# Patient Record
Sex: Female | Born: 1985 | Race: Black or African American | Hispanic: No | Smoking: Current every day smoker
Health system: Southern US, Community
[De-identification: ages and names within clinical notes are randomized; demographics above are authoritative.]

## PROBLEM LIST (undated history)

## (undated) HISTORY — PX: NO PAST SURGERIES: SHX2092

---

## 2005-11-07 ENCOUNTER — Ambulatory Visit (HOSPITAL_COMMUNITY): Admission: RE | Admit: 2005-11-07 | Discharge: 2005-11-07 | Payer: Self-pay | Admitting: Family Medicine

## 2005-12-06 ENCOUNTER — Ambulatory Visit (HOSPITAL_COMMUNITY): Admission: RE | Admit: 2005-12-06 | Discharge: 2005-12-06 | Payer: Self-pay | Admitting: Obstetrics and Gynecology

## 2007-06-03 IMAGING — US US OB COMP +14 WK
1 series · 13 of 28 positions shown · non-contrast
Comparison: none

CLINICAL DATA: 18 week 0 day assigned gestational age by prior ultrasound.  Evaluate fetal anatomy and growth.

[Series 1: us ob comp +14 wk · 0.39mm/px · 13 of 79 slices shown]
[im 3/79]
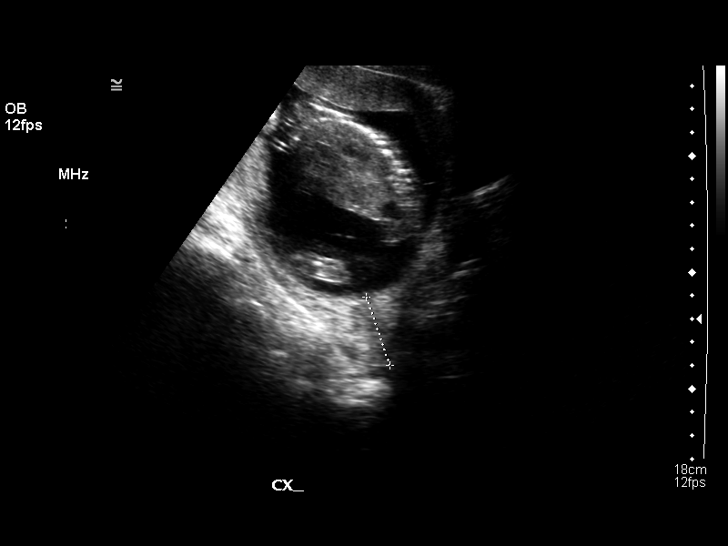
[im 9/79]
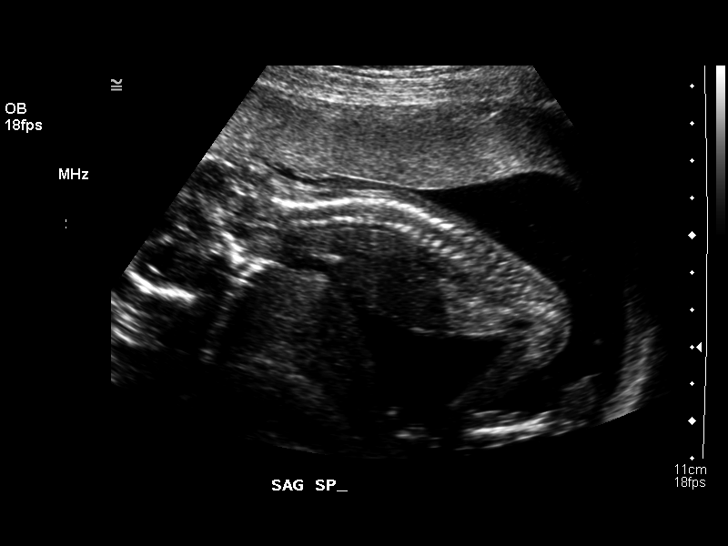
[im 15/79]
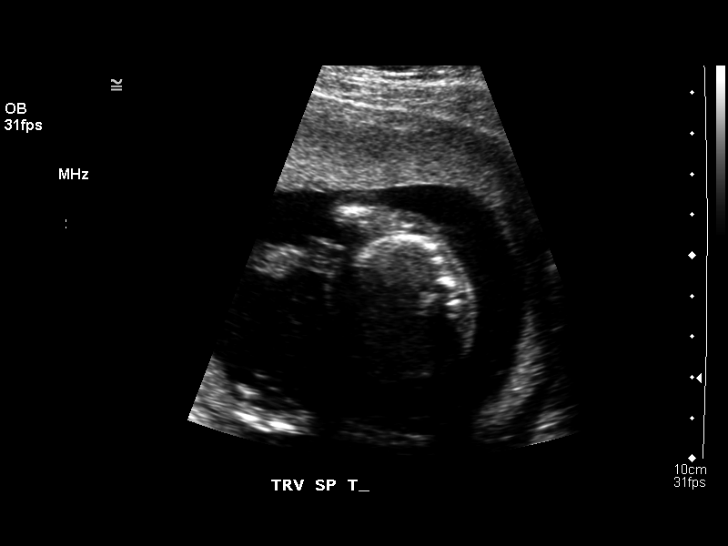
[im 21/79]
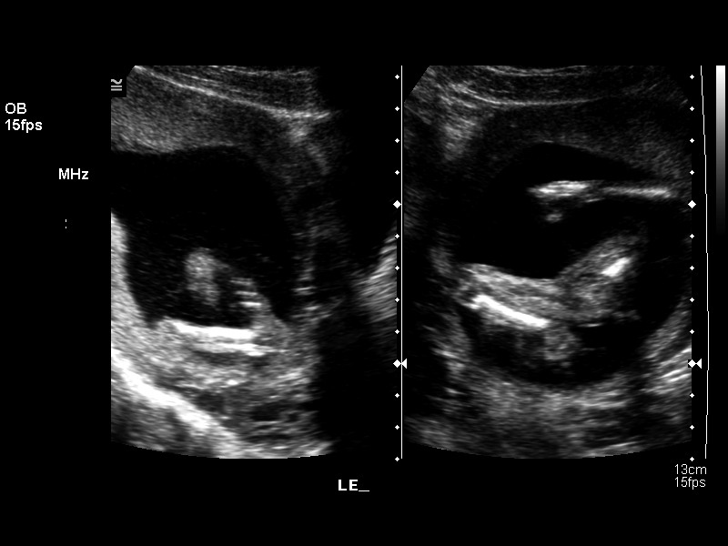
[im 27/79]
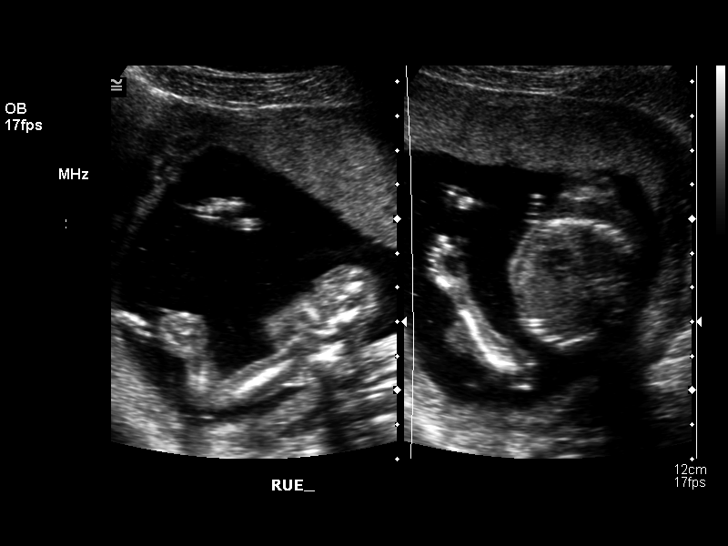
[im 32/79]
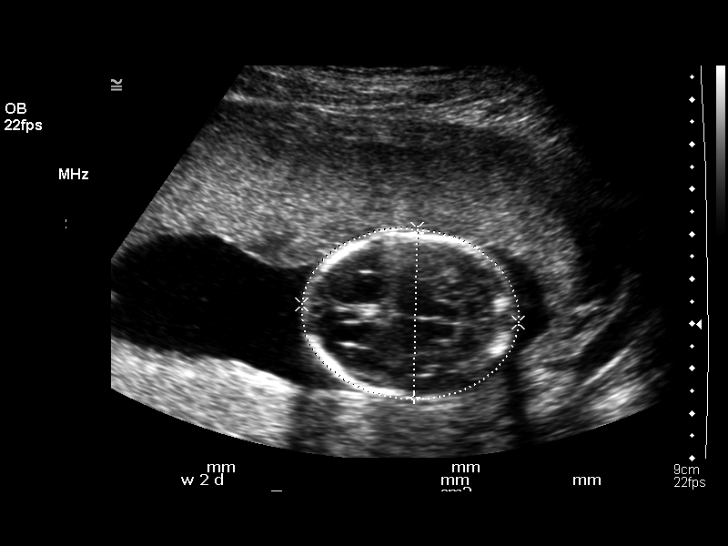
[im 41/79]
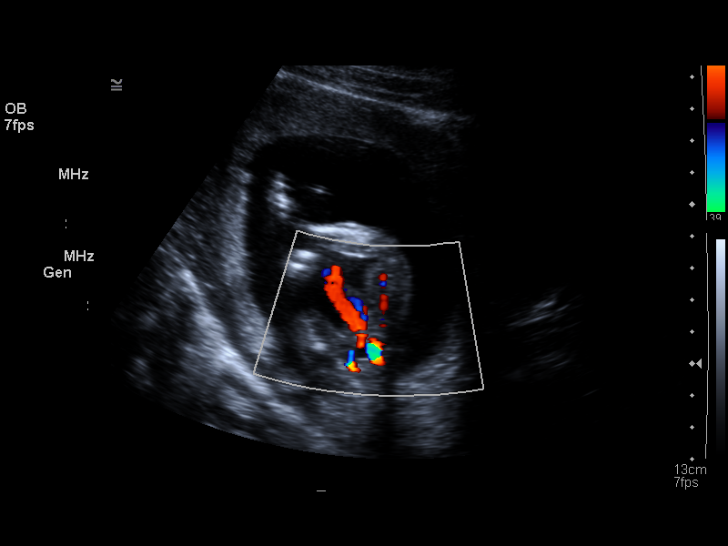
[im 47/79]
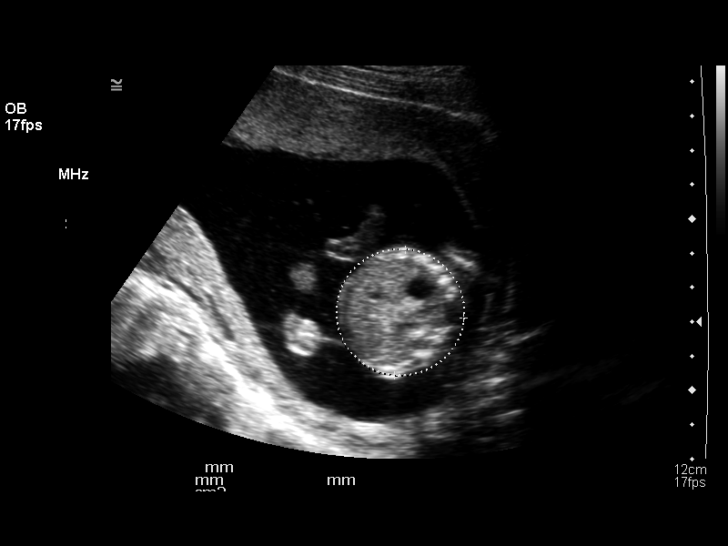
[im 53/79]
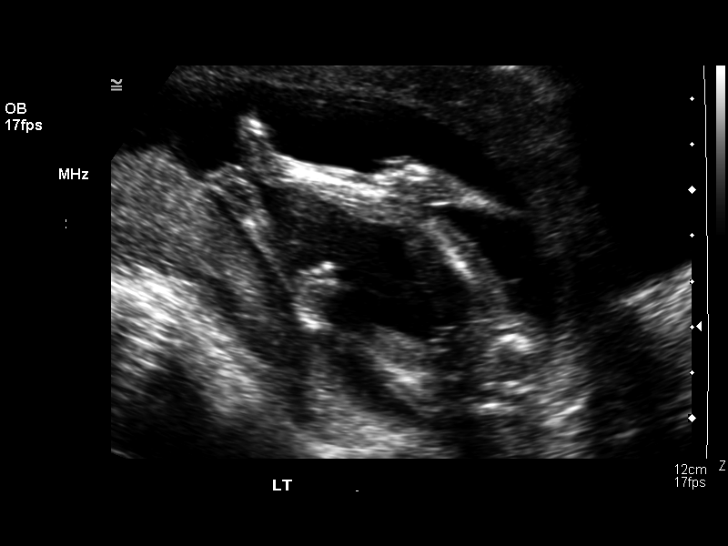
[im 58/79]
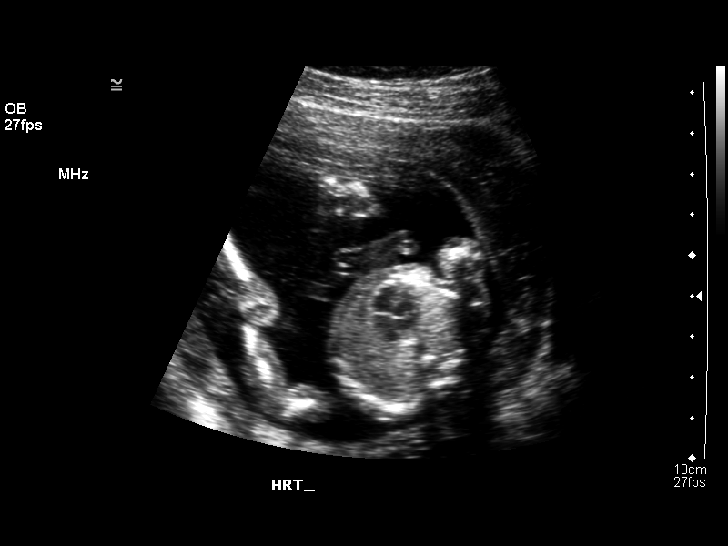
[im 64/79]
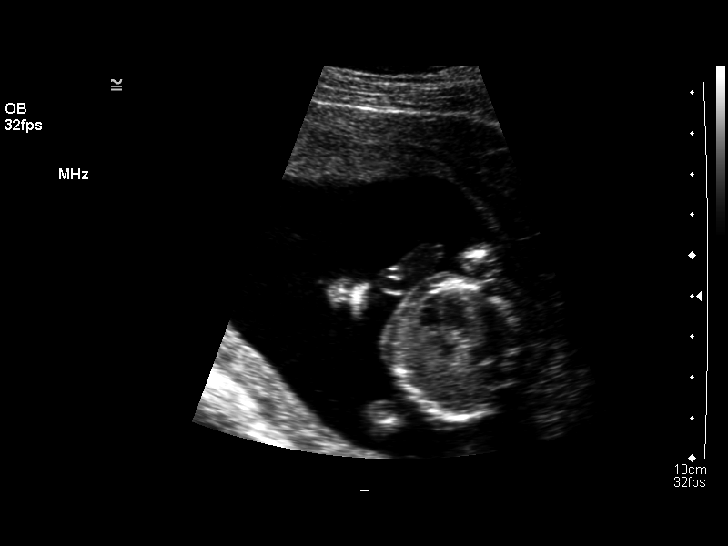
[im 70/79]
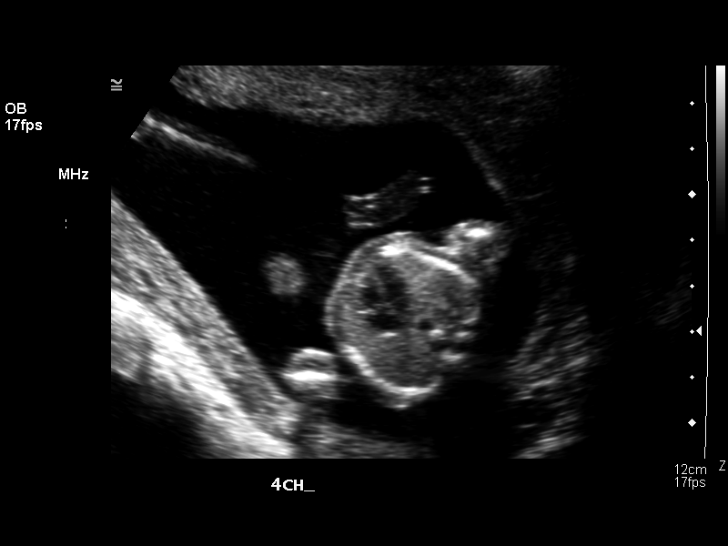
[im 76/79]
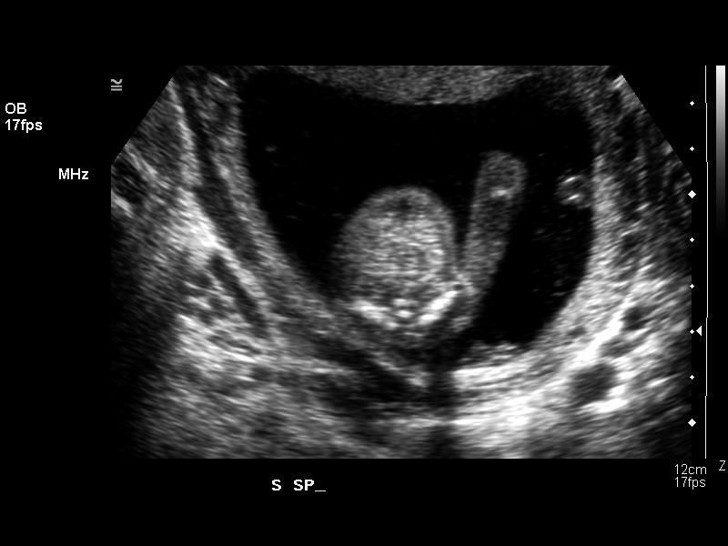

[13 of 28 positions shown; findings below may reference images not displayed]

OBSTETRICAL ULTRASOUND:
 Number of Fetuses:  1
 Heart Rate:  139 bpm
 Movement:  Yes
 Breathing:  No  
 Presentation:  Breech
 Placental Location:  Anterior, right lateral
 Grade:  I
 Previa:  No
 Amniotic Fluid (Subjective):  Normal
 Amniotic Fluid (Objective):   4.6 cm vertical pocket 

 FETAL BIOMETRY
 BPD:   3.7 cm  17 w 2 d
 HC:   13.9 cm  17 w 2 d
 AC:  11.7 cm  17 w 3 d
 FL:  2.4 cm  17 w 1 d

 MEAN GA:  17 w 2 d    US EDC:  05/14/06
 Assigned GA:  18 w 0 d    Assigned EDC:  05/09/06

 FETAL ANATOMY
 Lateral Ventricles:  Visualized  
 Thalami/CSP:  Visualized  
 Posterior Fossa:  Visualized   
 Nuchal Region:  Visualized 
 Spine:  Visualized  
 4 Chamber Heart on Left:  Visualized  
 Stomach on Left:  Visualized  
 3 Vessel Cord:  Visualized 
 Cord Insertion site:  Visualized 
 Kidneys:  Visualized   
 Bladder:  Visualized   
 Extremities:  Visualized  

 ADDITIONAL ANATOMY VISUALIZED:  LVOT, RVOT, upper lip, orbits, profile, diaphragm, heel, 5th digit, and aortic arch.

 MATERNAL UTERINE AND ADNEXAL FINDINGS
 Cervix: 3.1 cm transabdominal.
IMPRESSION: 1.  Single living intrauterine gestation with assigned gestational age of 18 week 0 days by prior ultrasound.  Appropriate fetal growth.
 2.  No evidence of fetal anatomic abnormality.

## 2013-11-07 ENCOUNTER — Ambulatory Visit (INDEPENDENT_AMBULATORY_CARE_PROVIDER_SITE_OTHER): Payer: Self-pay | Admitting: Family Medicine

## 2013-11-07 ENCOUNTER — Encounter: Payer: Self-pay | Admitting: Family Medicine

## 2013-11-07 VITALS — BP 98/82 | HR 90 | Temp 98.3°F | Ht 65.0 in | Wt 203.0 lb

## 2013-11-07 DIAGNOSIS — B353 Tinea pedis: Secondary | ICD-10-CM

## 2013-11-07 MED ORDER — ITRACONAZOLE 100 MG PO CAPS: 200.0000 mg | ORAL_CAPSULE | Freq: Two times a day (BID) | ORAL | Status: DC

## 2013-11-07 NOTE — Patient Instructions (Addendum)
Athlete's Foot Athlete's foot (tinea pedis) is a fungal infection of the skin on the feet. It often occurs on the skin between the toes or underneath the toes. It can also occur on the soles of the feet. Athlete's foot is more likely to occur in hot, humid weather. Not washing your feet or changing your socks often enough can contribute to athlete's foot. The infection can spread from person to person (contagious). CAUSES Athlete's foot is caused by a fungus. This fungus thrives in warm, moist places. Most people get athlete's foot by sharing shower stalls, towels, and wet floors with an infected person. People with weakened immune systems, including those with diabetes, may be more likely to get athlete's foot. SYMPTOMS   Itchy areas between the toes or on the soles of the feet.  White, flaky, or scaly areas between the toes or on the soles of the feet.  Tiny, intensely itchy blisters between the toes or on the soles of the feet.  Tiny cuts on the skin. These cuts can develop a bacterial infection.  Thick or discolored toenails. DIAGNOSIS  Your caregiver can usually tell what the problem is by doing a physical exam. Your caregiver may also take a skin sample from the rash area. The skin sample may be examined under a microscope, or it may be tested to see if fungus will grow in the sample. A sample may also be taken from your toenail for testing. TREATMENT  Over-the-counter and prescription medicines can be used to kill the fungus. These medicines are available as powders or creams. Your caregiver can suggest medicines for you. Fungal infections respond slowly to treatment. You may need to continue using your medicine for several weeks. PREVENTION   Do not share towels.  Wear sandals in wet areas, such as shared locker rooms and shared showers.  Keep your feet dry. Wear shoes that allow air to circulate. Wear cotton or wool socks. HOME CARE INSTRUCTIONS   Take medicines as directed by  your caregiver. Do not use steroid creams on athlete's foot.  Keep your feet clean and cool. Wash your feet daily and dry them thoroughly, especially between your toes.  Change your socks every day. Wear cotton or wool socks. In hot climates, you may need to change your socks 2 to 3 times per day.  Wear sandals or canvas tennis shoes with good air circulation.  If you have blisters, soak your feet in Burow's solution or Epsom salts for 20 to 30 minutes, 2 times a day to dry out the blisters. Make sure you dry your feet thoroughly afterward. SEEK MEDICAL CARE IF:   You have a fever.  You have swelling, soreness, warmth, or redness in your foot.  You are not getting better after 7 days of treatment.  You are not completely cured after 30 days.  You have any problems caused by your medicines. MAKE SURE YOU:   Understand these instructions.  Will watch your condition.  Will get help right away if you are not doing well or get worse. Document Released: 04/07/2000 Document Revised: 07/03/2011 Document Reviewed: 01/27/2011 [facility] Patient Information 2015 [place], [place]. This information is not intended to replace advice given to you by your health care provider. Make sure you discuss any questions you have with your health care provider. You Can Quit Smoking If you are ready to quit smoking or are thinking about it, congratulations! You have chosen to help yourself be healthier and live longer! There are lots of  different ways to quit smoking. Nicotine gum, nicotine patches, a nicotine inhaler, or nicotine nasal spray can help with physical craving. Hypnosis, support groups, and medicines help break the habit of smoking. TIPS TO GET OFF AND STAY OFF CIGARETTES  Learn to predict your moods. Do not let a bad situation be your excuse to have a cigarette. Some situations in your life might tempt you to have a cigarette.  Ask friends and co-workers not to smoke around you.  Make your home  smoke-free.  Never have "just one" cigarette. It leads to wanting another and another. Remind yourself of your decision to quit.  On a card, make a list of your reasons for not smoking. Read it at least the same number of times a day as you have a cigarette. Tell yourself everyday, "I do not want to smoke. I choose not to smoke."  Ask someone at home or work to help you with your plan to quit smoking.  Have something planned after you eat or have a cup of coffee. Take a walk or get other exercise to perk you up. This will help to keep you from overeating.  Try a relaxation exercise to calm you down and decrease your stress. Remember, you may be tense and nervous the first two weeks after you quit. This will pass.  Find new activities to keep your hands busy. Play with a pen, coin, or rubber band. Doodle or draw things on paper.  Brush your teeth right after eating. This will help cut down the craving for the taste of tobacco after meals. You can try mouthwash too.  Try gum, breath mints, or diet candy to keep something in your mouth. IF YOU SMOKE AND WANT TO QUIT:  Do not stock up on cigarettes. Never buy a carton. Wait until one pack is finished before you buy another.  Never carry cigarettes with you at work or at home.  Keep cigarettes as far away from you as possible. Leave them with someone else.  Never carry matches or a lighter with you.  Ask yourself, "Do I need this cigarette or is this just a reflex?"  Bet with someone that you can quit. Put cigarette money in a piggy bank every morning. If you smoke, you give up the money. If you do not smoke, by the end of the week, you keep the money.  Keep trying. It takes 21 days to change a habit!  Talk to your doctor about using medicines to help you quit. These include nicotine replacement gum, lozenges, or skin patches. Document Released: 02/04/2009 Document Revised: 07/03/2011 Document Reviewed: 02/04/2009 [facility] Patient  Information 2015 [place], [place]. This information is not intended to replace advice given to you by your health care provider. Make sure you discuss any questions you have with your health care provider. Smoking Cessation, Tips for Success If you are ready to quit smoking, congratulations! You have chosen to help yourself be healthier. Cigarettes bring nicotine, tar, carbon monoxide, and other irritants into your body. Your lungs, heart, and blood vessels will be able to work better without these poisons. There are many different ways to quit smoking. Nicotine gum, nicotine patches, a nicotine inhaler, or nicotine nasal spray can help with physical craving. Hypnosis, support groups, and medicines help break the habit of smoking. WHAT THINGS CAN I DO TO MAKE QUITTING EASIER?  Here are some tips to help you quit for good:  Pick a date when you will quit smoking completely. Tell all  of your friends and family about your plan to quit on that date.  Do not try to slowly cut down on the number of cigarettes you are smoking. Pick a quit date and quit smoking completely starting on that day.  Throw away all cigarettes.   Clean and remove all ashtrays from your home, work, and car.   On a card, write down your reasons for quitting. Carry the card with you and read it when you get the urge to smoke.   Cleanse your body of nicotine. Drink enough water and fluids to keep your urine clear or pale yellow. Do this after quitting to flush the nicotine from your body.   Learn to predict your moods. Do not let a bad situation be your excuse to have a cigarette. Some situations in your life might tempt you into wanting a cigarette.   Never have "just one" cigarette. It leads to wanting another and another. Remind yourself of your decision to quit.   Change habits associated with smoking. If you smoked while driving or when feeling stressed, try other activities to replace smoking. Stand up when drinking  your coffee. Brush your teeth after eating. Sit in a different chair when you read the paper. Avoid alcohol while trying to quit, and try to drink fewer caffeinated beverages. Alcohol and caffeine may urge you to smoke.   Avoid foods and drinks that can trigger a desire to smoke, such as sugary or spicy foods and alcohol.   Ask people who smoke not to smoke around you.   Have something planned to do right after eating or having a cup of coffee. For example, plan to take a walk or exercise.   Try a relaxation exercise to calm you down and decrease your stress. Remember, you may be tense and nervous for the first 2 weeks after you quit, but this will pass.   Find new activities to keep your hands busy. Play with a pen, coin, or rubber band. Doodle or draw things on paper.   Brush your teeth right after eating. This will help cut down on the craving for the taste of tobacco after meals. You can also try mouthwash.   Use oral substitutes in place of cigarettes. Try using lemon drops, carrots, cinnamon sticks, or chewing gum. Keep them handy so they are available when you have the urge to smoke.   When you have the urge to smoke, try deep breathing.   Designate your home as a nonsmoking area.   If you are a heavy smoker, ask your health care provider about a prescription for nicotine chewing gum. It can ease your withdrawal from nicotine.   Reward yourself. Set aside the cigarette money you save and buy yourself something nice.   Look for support from others. Join a support group or smoking cessation program. Ask someone at home or at work to help you with your plan to quit smoking.   Always ask yourself, "Do I need this cigarette or is this just a reflex?" Tell yourself, "Today, I choose not to smoke," or "I do not want to smoke." You are reminding yourself of your decision to quit.  Do not replace cigarette smoking with electronic cigarettes (commonly called e-cigarettes). The  safety of e-cigarettes is unknown, and some may contain harmful chemicals.  If you relapse, do not give up! Plan ahead and think about what you will do the next time you get the urge to smoke.  HOW WILL I FEEL  WHEN I QUIT SMOKING? You may have symptoms of withdrawal because your body is used to nicotine (the addictive substance in cigarettes). You may crave cigarettes, be irritable, feel very hungry, cough often, get headaches, or have difficulty concentrating. The withdrawal symptoms are only temporary. They are strongest when you first quit but will go away within 10-14 days. When withdrawal symptoms occur, stay in control. Think about your reasons for quitting. Remind yourself that these are signs that your body is healing and getting used to being without cigarettes. Remember that withdrawal symptoms are easier to treat than the major diseases that smoking can cause.  Even after the withdrawal is over, expect periodic urges to smoke. However, these cravings are generally short lived and will go away whether you smoke or not. Do not smoke!  WHAT RESOURCES ARE AVAILABLE TO HELP ME QUIT SMOKING? Your health care provider can direct you to community resources or hospitals for support, which may include:  Group support.  Education.  Hypnosis.  Therapy. Document Released: 01/07/2004 Document Revised: 01/29/2013 Document Reviewed: 09/26/2012 [facility] Patient Information 2015 [place], [place]. This information is not intended to replace advice given to you by your health care provider. Make sure you discuss any questions you have with your health care provider. Smoking Cessation Quitting smoking is important to your health and has many advantages. However, it is not always easy to quit since nicotine is a very addictive drug. Often times, people try 3 times or more before being able to quit. This document explains the best ways for you to prepare to quit smoking. Quitting takes hard work and a lot of  effort, but you can do it. ADVANTAGES OF QUITTING SMOKING  You will live longer, feel better, and live better.  Your body will feel the impact of quitting smoking almost immediately.  Within 20 minutes, blood pressure decreases. Your pulse returns to its normal level.  After 8 hours, carbon monoxide levels in the blood return to normal. Your oxygen level increases.  After 24 hours, the chance of having a heart attack starts to decrease. Your breath, hair, and body stop smelling like smoke.  After 48 hours, damaged nerve endings begin to recover. Your sense of taste and smell improve.  After 72 hours, the body is virtually free of nicotine. Your bronchial tubes relax and breathing becomes easier.  After 2 to 12 weeks, lungs can hold more air. Exercise becomes easier and circulation improves.  The risk of having a heart attack, stroke, cancer, or lung disease is greatly reduced.  After 1 year, the risk of coronary heart disease is cut in half.  After 5 years, the risk of stroke falls to the same as a nonsmoker.  After 10 years, the risk of lung cancer is cut in half and the risk of other cancers decreases significantly.  After 15 years, the risk of coronary heart disease drops, usually to the level of a nonsmoker.  If you are pregnant, quitting smoking will improve your chances of having a healthy baby.  The people you live with, especially any children, will be healthier.  You will have extra money to spend on things other than cigarettes. QUESTIONS TO THINK ABOUT BEFORE ATTEMPTING TO QUIT You may want to talk about your answers with your caregiver.  Why do you want to quit?  If you tried to quit in the past, what helped and what did not?  What will be the most difficult situations for you after you quit? How will  you plan to handle them?  Who can help you through the tough times? Your family? Friends? A caregiver?  What pleasures do you get from smoking? What ways can you  still get pleasure if you quit? Here are some questions to ask your caregiver:  How can you help me to be successful at quitting?  What medicine do you think would be best for me and how should I take it?  What should I do if I need more help?  What is smoking withdrawal like? How can I get information on withdrawal? GET READY  Set a quit date.  Change your environment by getting rid of all cigarettes, ashtrays, matches, and lighters in your home, car, or work. Do not let people smoke in your home.  Review your past attempts to quit. Think about what worked and what did not. GET SUPPORT AND ENCOURAGEMENT You have a better chance of being successful if you have help. You can get support in many ways.  Tell your family, friends, and co-workers that you are going to quit and need their support. Ask them not to smoke around you.  Get individual, group, or telephone counseling and support. Programs are available at [employer] and health centers. Call your local health department for information about programs in your area.  Spiritual beliefs and practices may help some smokers quit.  Download a "quit meter" on your computer to keep track of quit statistics, such as how long you have gone without smoking, cigarettes not smoked, and money saved.  Get a self-help book about quitting smoking and staying off of tobacco. LEARN NEW SKILLS AND BEHAVIORS  Distract yourself from urges to smoke. Talk to someone, go for a walk, or occupy your time with a task.  Change your normal routine. Take a different route to work. Drink tea instead of coffee. Eat breakfast in a different place.  Reduce your stress. Take a hot bath, exercise, or read a book.  Plan something enjoyable to do every day. Reward yourself for not smoking.  Explore interactive web-based programs that specialize in helping you quit. GET MEDICINE AND USE IT CORRECTLY Medicines can help you stop smoking and decrease the urge  to smoke. Combining medicine with the above behavioral methods and support can greatly increase your chances of successfully quitting smoking.  Nicotine replacement therapy helps deliver nicotine to your body without the negative effects and risks of smoking. Nicotine replacement therapy includes nicotine gum, lozenges, inhalers, nasal sprays, and skin patches. Some may be available over-the-counter and others require a prescription.  Antidepressant medicine helps people abstain from smoking, but how this works is unknown. This medicine is available by prescription.  Nicotinic receptor partial agonist medicine simulates the effect of nicotine in your brain. This medicine is available by prescription. Ask your caregiver for advice about which medicines to use and how to use them based on your health history. Your caregiver will tell you what side effects to look out for if you choose to be on a medicine or therapy. Carefully read the information on the package. Do not use any other product containing nicotine while using a nicotine replacement product.  RELAPSE OR DIFFICULT SITUATIONS Most relapses occur within the first 3 months after quitting. Do not be discouraged if you start smoking again. Remember, most people try several times before finally quitting. You may have symptoms of withdrawal because your body is used to nicotine. You may crave cigarettes, be irritable, feel very hungry, cough often, get  headaches, or have difficulty concentrating. The withdrawal symptoms are only temporary. They are strongest when you first quit, but they will go away within 10-14 days. To reduce the chances of relapse, try to:  Avoid drinking alcohol. Drinking lowers your chances of successfully quitting.  Reduce the amount of caffeine you consume. Once you quit smoking, the amount of caffeine in your body increases and can give you symptoms, such as a rapid heartbeat, sweating, and anxiety.  Avoid smokers because  they can make you want to smoke.  Do not let weight gain distract you. Many smokers will gain weight when they quit, usually less than 10 pounds. Eat a healthy diet and stay active. You can always lose the weight gained after you quit.  Find ways to improve your mood other than smoking. FOR MORE INFORMATION  www.smokefree.gov  Document Released: 04/04/2001 Document Revised: 10/10/2011 Document Reviewed: 07/20/2011 [facility] Patient Information 2015 [place], [place]. This information is not intended to replace advice given to you by your health care provider. Make sure you discuss any questions you have with your health care provider.

## 2013-11-07 NOTE — Progress Notes (Signed)
Patient ID: Theresa Craig, female   DOB: [birth year], 28 y.o.   MRN: [id number]   [facility]alston[facility] G. Xayvier Vallez, MD Phone: [phone]  Subjective:   # Pt. Presents desiring to establish care. Her only complaint today is itching and "athlete's foot" on R foot for one year. She has tried OTC antifungal cream with limited success. She has changed socks, shoes, and tried foot powder with little relief. She denies history of STI or exposure. She says that she has no other medical problems or allergies. She has no other complaints.    All systems were reviewed and were negative unless otherwise noted in the HPI  Past Medical History There are no active problems to display for this patient.  Reviewed problem list.  Medications- reviewed and updated. None at this time Chief complaint-noted No additions to family history Social history- patient is a 1ppd smoker. Cessation discussed.   Objective: BP 98/82  Pulse 90  Temp(Src) 98.3 F (36.8 C) (Oral)  Ht 5\' 5"  (1.651 m)  Wt 203 lb (92.08 kg)  BMI 33.78 kg/m2  LMP 10/31/2013 Gen: NAD, alert, cooperative with exam HEENT: NCAT, EOMI, PERRL Neck: FROM, supple CV: RRR, good S1/S2, no murmur Resp: CTABL, no wheezes, non-labored Abd: SNTND, BS present, no guarding or organomegaly Ext: No edema, warm, normal tone, moves UE/LE spontaneously  Neuro: Alert and oriented, No gross deficits Skin:  small scaling patches noted on bottom of R foot with extension to sidewall of foot. Scaling between digits 1-2 noted as well.    Assessment/Plan: Pt. Is in overall good health for her age. 1ppd smoker. Chronic tinea pedis.  1. Itraconazole 200mg  BID x 7 days for chronic Tinea Pedis 2. Smoking cessation discussed. Pt. To begin formulating  3. Return for worsening symptoms or for any other concern.   Thanks for letting us take care of you!

## 2013-11-10 NOTE — Progress Notes (Signed)
[facility] ATTENDING  NOTE Nicolette BangKehinde Rubie Ficco,MD I have discussed this patient with the resident. I agree with the resident's findings, assessment and care plan.Topical therapy recommended first, but since this has been chronic oral antifungal was given.

## 2013-12-17 ENCOUNTER — Ambulatory Visit (INDEPENDENT_AMBULATORY_CARE_PROVIDER_SITE_OTHER): Payer: Medicaid Other | Admitting: Family Medicine

## 2013-12-17 VITALS — BP 94/68 | HR 69 | Temp 98.6°F | Ht 65.0 in | Wt 199.0 lb

## 2013-12-17 DIAGNOSIS — N390 Urinary tract infection, site not specified: Secondary | ICD-10-CM | POA: Insufficient documentation

## 2013-12-17 DIAGNOSIS — M545 Low back pain, unspecified: Secondary | ICD-10-CM

## 2013-12-17 LAB — POCT URINALYSIS DIPSTICK
Bilirubin, UA: NEGATIVE
Glucose, UA: NEGATIVE
KETONES UA: NEGATIVE
Nitrite, UA: POSITIVE
PH UA: 7
PROTEIN UA: 100
SPEC GRAV UA: 1.02
UROBILINOGEN UA: 1

## 2013-12-17 LAB — POCT UA - MICROSCOPIC ONLY

## 2013-12-17 MED ORDER — CYCLOBENZAPRINE HCL 5 MG PO TABS: 5.0000 mg | ORAL_TABLET | Freq: Three times a day (TID) | ORAL | Status: DC | PRN

## 2013-12-17 MED ORDER — IBUPROFEN 600 MG PO TABS: 600.0000 mg | ORAL_TABLET | Freq: Three times a day (TID) | ORAL | Status: DC | PRN

## 2013-12-17 MED ORDER — CEPHALEXIN 500 MG PO CAPS: 500.0000 mg | ORAL_CAPSULE | Freq: Four times a day (QID) | ORAL | Status: AC

## 2013-12-17 NOTE — Assessment & Plan Note (Signed)
Patient without precipitating injury or strain. May be related to UTI but do not think so. Will treat with ibuprofen and flexeril. Suspect will improve in the next few days.

## 2013-12-17 NOTE — Assessment & Plan Note (Signed)
Urinalysis suggests UTI. Treating with Keflex  QID x7 days. Do not suspect pyelonephritis, although reviewed red flags with patient, including fever, nausea, vomiting and flank pain. Patient voiced understanding.

## 2013-12-17 NOTE — Progress Notes (Signed)
    Subjective   Theresa Craig is a 28 y.o. female that presents for a same day visit  1. Back pain: Symptoms started Friday and have worsened. Pressure. Has affected her sleep. No recent injury. Pain does not radiate. Walking for a long time worsens pain. Has taken some analgesic which help somewhat but pain returns. Epsom salt has helped a little bit as well. Last bowel movement last night without difficulty occuring once per day. Pressure with urination. Frequency, urgency, hesitancy. No fevers but has had some feeling of hot flashes. No fevers, nausea or vomiting.  History  Substance Use Topics  . Smoking status: Current Every Day Smoker -- 1.00 packs/day    Types: Cigarettes  . Smokeless tobacco: Not on file  . Alcohol Use: Not on file    ROS Per HPI  Objective   BP 94/68  Pulse 69  Temp(Src) 98.6 F (37 C) (Oral)  Ht  (1.651 m)  Wt 199 lb (90.266 kg)  BMI 33.12 kg/m2  General: well appearing female in no acute distress GI: soft, non-tender, non-distended, no CVA tenderness Musculoskeletal: tenderness along lumbar spine. No bony tenderness. No spasms palpated  Assessment and Plan   Please refer to problem based charting of assessment and plan

## 2013-12-17 NOTE — Patient Instructions (Addendum)
Thank you for coming to see me today. It was a pleasure. Today we talked about:   Back pain: your pain seems to be musculoskeletal. I will prescribe some flexeril and recommend ibuprofen  every 6 hours for the next few days to help with your pain.  Pressure with urination: it looks like you have a urinary tract infection. I am prescribing medicatino for you.  Please make an appointment to see Dr, Jaquita Rector.  If you have any questions or concerns, please do not hesitate to call the office at [phone].  Sincerely,  Jacquelin Hawking, MD

## 2013-12-19 ENCOUNTER — Telehealth: Payer: Self-pay | Admitting: Family Medicine

## 2013-12-19 NOTE — Telephone Encounter (Signed)
Seen by Dr. Caleb Popp on 8/26. Pt requesting a letter excusing her for 8/26 and 8/27. Pls call for pick up.

## 2013-12-22 ENCOUNTER — Encounter: Payer: Self-pay | Admitting: Family Medicine

## 2013-12-22 NOTE — Telephone Encounter (Signed)
Letter written and set up front for pick up[, patient notified

## 2016-03-30 ENCOUNTER — Ambulatory Visit (INDEPENDENT_AMBULATORY_CARE_PROVIDER_SITE_OTHER): Payer: BC Managed Care – PPO

## 2016-03-30 ENCOUNTER — Ambulatory Visit (INDEPENDENT_AMBULATORY_CARE_PROVIDER_SITE_OTHER): Payer: BC Managed Care – PPO | Admitting: Physician Assistant

## 2016-03-30 VITALS — BP 116/72 | HR 72 | Temp 98.1°F | Resp 18 | Ht 65.0 in | Wt 216.0 lb

## 2016-03-30 DIAGNOSIS — R05 Cough: Secondary | ICD-10-CM

## 2016-03-30 DIAGNOSIS — R21 Rash and other nonspecific skin eruption: Secondary | ICD-10-CM | POA: Diagnosis not present

## 2016-03-30 DIAGNOSIS — R059 Cough, unspecified: Secondary | ICD-10-CM

## 2016-03-30 MED ORDER — CLOTRIMAZOLE-BETAMETHASONE 1-0.05 % EX CREA: 1.0000 "application " | TOPICAL_CREAM | Freq: Two times a day (BID) | CUTANEOUS | 0 refills | Status: DC

## 2016-03-30 MED ORDER — BENZONATATE 200 MG PO CAPS: 200.0000 mg | ORAL_CAPSULE | Freq: Two times a day (BID) | ORAL | 0 refills | Status: DC | PRN

## 2016-03-30 MED ORDER — PHENYLEPHRINE-CHLORPHEN-DM 3.5-1-3 MG/ML PO LIQD: 1.0000 mL | Freq: Four times a day (QID) | ORAL | 0 refills | Status: DC | PRN

## 2016-03-30 NOTE — Addendum Note (Signed)
Addended by: Ofilia NeasLARK, MICHAEL L on: 03/30/2016 01:46 PM   Modules accepted: Orders

## 2016-03-30 NOTE — Patient Instructions (Signed)
     IF you received an x-ray today, you will receive an invoice from [facility]. Please contact [facility] at [phone] with questions or concerns regarding your invoice.   IF you received labwork today, you will receive an invoice from [facility]. Please contact [facility] at [phone] with questions or concerns regarding your invoice.   Our billing staff will not be able to assist you with questions regarding bills from these companies.  You will be contacted with the lab results as soon as they are available. The fastest way to get your results is to activate your My Chart account. Instructions are located on the last page of this paperwork. If you have not heard from us regarding the results in 2 weeks, please contact this office.      

## 2016-03-30 NOTE — Progress Notes (Addendum)
03/30/2016 12:34 PM   DOB: 03-19-1986 / MRN: [id number]  SUBJECTIVE:  Theresa Craig is a 30 y.o. female presenting for cough that started about 14 days ago.  She continues to have cough with mucous.  She denies a history of asthma.  She does smoke about a pack a day for 10 years now.  She denies taking any form of birth control.   Complains of a rash that has been on her feet "for a minute." Complains of itching.  Had a cream from another doc that did not really work.   She has No Known Allergies.   She  has no past medical history on file.    She  reports that she has been smoking Cigarettes.  She has been smoking about 1.00 pack per day. She has never used smokeless tobacco. She reports that she drinks alcohol. She reports that she does not use drugs. She  has no sexual activity history on file. The patient  has no past surgical history on file.  Her family history is not on file.  Review of Systems  Constitutional: Negative for chills and fever.  Respiratory: Positive for cough, sputum production and shortness of breath. Negative for hemoptysis.   Cardiovascular: Positive for chest pain (with cough only). Negative for leg swelling.  Gastrointestinal: Negative for abdominal pain and nausea.  Genitourinary: Negative for dysuria, frequency and urgency.  Skin: Negative for rash.  Neurological: Positive for headaches. Negative for dizziness.    The problem list and medications were reviewed and updated by myself where necessary and exist elsewhere in the encounter.   OBJECTIVE:  BP 116/72 (BP Location: Right Arm, Patient Position: Sitting, Cuff Size: Large)   Pulse 72   Temp 98.1 F (36.7 C) (Oral)   Resp 18   Ht 5\' 5"  (1.651 m)   Wt 216 lb (98 kg)   LMP 03/23/2016   SpO2 98%   BMI 35.94 kg/m   Physical Exam  Constitutional: She is oriented to person, place, and time. She appears well-developed and well-nourished. No distress.  Cardiovascular: Normal rate, regular rhythm  and normal heart sounds.   Pulmonary/Chest: Effort normal and breath sounds normal. No respiratory distress. She has no wheezes. She has no rales. She exhibits no tenderness.  Musculoskeletal: Normal range of motion. She exhibits no edema or tenderness.  Neurological: She is alert and oriented to person, place, and time.  Skin: Skin is warm and dry. She is not diaphoretic.  Psychiatric: She has a normal mood and affect.    No results found for this or any previous visit (from the past 72 hour(s)).  Dg Chest 2 View  Result Date: 03/30/2016 CLINICAL DATA:  Cough for 2 weeks.  Smoker. EXAM: CHEST  2 VIEW COMPARISON:  None. FINDINGS: The heart size and mediastinal contours are within normal limits. Both lungs are clear. The visualized skeletal structures are unremarkable. IMPRESSION: Negative.  No active cardiopulmonary disease. Electronically Signed   By: Myles RosenthalJohn  Stahl M.D.   On: 03/30/2016 12:15    ASSESSMENT AND PLAN  [place] was seen today for cough.  Diagnoses and all orders for this visit:  Cough -     DG Chest 2 View; Future -     chlorpheniramine-phenylephrine-dextromethorphan (CARDEC DM) 3.5-1-3 MG/ML solution; Take 1 mL by mouth every 6 (six) hours as needed for cough. -     benzonatate (TESSALON) 200 MG capsule; Take 1 capsule (200 mg total) by mouth 2 (two) times daily as needed  for cough.  Rash and nonspecific skin eruption -     clotrimazole-betamethasone (LOTRISONE) cream; Apply 1 application topically 2 (two) times daily.    The patient is advised to call or return to clinic if she does not see an improvement in symptoms, or to seek the care of the closest emergency department if she worsens with the above plan.   Deliah BostonMichael Doreatha Offer, MHS, PA-C [facility] 03/30/2016 12:34 PM

## 2016-08-29 ENCOUNTER — Encounter: Payer: Self-pay | Admitting: Physician Assistant

## 2016-08-29 ENCOUNTER — Ambulatory Visit (INDEPENDENT_AMBULATORY_CARE_PROVIDER_SITE_OTHER): Payer: BC Managed Care – PPO | Admitting: Physician Assistant

## 2016-08-29 VITALS — BP 104/68 | HR 63 | Temp 98.1°F | Resp 18 | Ht 65.0 in | Wt 208.8 lb

## 2016-08-29 DIAGNOSIS — L819 Disorder of pigmentation, unspecified: Secondary | ICD-10-CM | POA: Diagnosis not present

## 2016-08-29 MED ORDER — KETOCONAZOLE 2 % EX SHAM: 1.0000 "application " | MEDICATED_SHAMPOO | CUTANEOUS | 0 refills | Status: DC

## 2016-08-29 MED ORDER — CLOTRIMAZOLE-BETAMETHASONE 1-0.05 % EX CREA: 1.0000 "application " | TOPICAL_CREAM | Freq: Two times a day (BID) | CUTANEOUS | 0 refills | Status: DC

## 2016-08-29 NOTE — Progress Notes (Signed)
  09/01/2016 8:46 AM   DOB: 1985-12-19 / MRN: [id number]  SUBJECTIVE:  Theresa Craig is a 31 y.o. female presenting for rash that started 3-4 weeks ago and is in "patches." there is no itching.  She denies any pain with the rash.  She has tried which hazel and multiple creams.  She tells me it is getting worse. She denies medication changes.   She has No Known Allergies.   She  has no past medical history on file.    She  reports that she has been smoking Cigarettes.  She has been smoking about 1.00 pack per day. She has never used smokeless tobacco. She reports that she drinks about 3.6 oz of alcohol per week . She reports that she does not use drugs. She is sexually active with men.    Review of Systems  Constitutional: Negative for chills, diaphoresis and fever.  Respiratory: Negative for cough, hemoptysis, sputum production, shortness of breath and wheezing.   Cardiovascular: Negative for chest pain, orthopnea and leg swelling.  Gastrointestinal: Negative for nausea.  Skin: Negative for rash.  Neurological: Negative for dizziness.    The problem list and medications were reviewed and updated by myself where necessary and exist elsewhere in the encounter.   OBJECTIVE:  BP 104/68 (BP Location: Right Arm, Patient Position: Sitting, Cuff Size: Large)   Pulse 63   Temp 98.1 F (36.7 C) (Oral)   Resp 18   Ht 5\' 5"  (1.651 m)   Wt 208 lb 12.8 oz (94.7 kg)   LMP 08/10/2016 (Approximate)   SpO2 99%   BMI 34.75 kg/m   Physical Exam  Cardiovascular: Normal rate and regular rhythm.   Pulmonary/Chest: Effort normal and breath sounds normal.  Skin: Rash (Macular hyperpigmented patches with 1 cm to 3 cm diameter size variation. ) noted. No erythema. No pallor.      No results found for this or any previous visit (from the past 72 hour(s)).  No results found.  ASSESSMENT AND PLAN:  Theresa Craig was seen today for rash.  Diagnoses and all orders for this  visit:  Hyperpigmentation of skin: Suspect malazia furfur.  Will treat to that end.  She will come back in ten days if not improved.  -     clotrimazole-betamethasone (LOTRISONE) cream; Apply 1 application topically 2 (two) times daily. -     ketoconazole (NIZORAL) 2 % shampoo; Apply 1 application topically 2 (two) times a week.    The patient is advised to call or return to clinic if she does not see an improvement in symptoms, or to seek the care of the closest emergency department if she worsens with the above plan.   Deliah BostonMichael Clark, MHS, PA-C [facility] 09/01/2016 8:46 AM

## 2016-08-29 NOTE — Patient Instructions (Signed)
     IF you received an x-ray today, you will receive an invoice from [facility]. Please contact [facility] at [phone] with questions or concerns regarding your invoice.   IF you received labwork today, you will receive an invoice from [facility]. Please contact [facility] at [phone] with questions or concerns regarding your invoice.   Our billing staff will not be able to assist you with questions regarding bills from these companies.  You will be contacted with the lab results as soon as they are available. The fastest way to get your results is to activate your My Chart account. Instructions are located on the last page of this paperwork. If you have not heard from us regarding the results in 2 weeks, please contact this office.     

## 2019-05-21 ENCOUNTER — Other Ambulatory Visit: Payer: Self-pay

## 2019-05-21 ENCOUNTER — Ambulatory Visit: Payer: BC Managed Care – PPO | Admitting: Family Medicine

## 2019-05-21 ENCOUNTER — Encounter: Payer: Self-pay | Admitting: Family Medicine

## 2019-05-21 VITALS — BP 108/70 | HR 76 | Temp 98.0°F | Ht 65.0 in | Wt 198.0 lb

## 2019-05-21 DIAGNOSIS — Z7689 Persons encountering health services in other specified circumstances: Secondary | ICD-10-CM | POA: Diagnosis not present

## 2019-05-21 DIAGNOSIS — Z1329 Encounter for screening for other suspected endocrine disorder: Secondary | ICD-10-CM | POA: Diagnosis not present

## 2019-05-21 DIAGNOSIS — K5909 Other constipation: Secondary | ICD-10-CM

## 2019-05-21 NOTE — Patient Instructions (Addendum)
Take metamucil before the first meal of the day once a day.  Dissolve in water and drink quickly.    If you have lab work done today you will be contacted with your lab results within the next 2 weeks.  If you have not heard from [place] then please contact us. The fastest way to get your results is to register for My Chart.   IF you received an x-ray today, you will receive an invoice from [facility]. Please contact [facility] at [phone] with questions or concerns regarding your invoice.   IF you received labwork today, you will receive an invoice from [facility]. Please contact [facility] at [phone] with questions or concerns regarding your invoice.   Our billing staff will not be able to assist you with questions regarding bills from these companies.  You will be contacted with the lab results as soon as they are available. The fastest way to get your results is to activate your My Chart account. Instructions are located on the last page of this paperwork. If you have not heard from [place] regarding the results in 2 weeks, please contact this office.      Chronic Constipation  Chronic constipation is a condition in which a person has three or fewer bowel movements a week, for three months or longer. This condition is especially common in older adults. The two main kinds of chronic constipation are secondary constipation and functional constipation. Secondary constipation results from another condition or a treatment. Functional constipation, also called primary or idiopathic constipation, is divided into three types:  Normal transit constipation. In this type, movement of stool through the colon (stool transit) occurs normally.  Slow transit constipation. In this type, stool moves slowly through the colon.  Outlet constipation or pelvic floor dysfunction. In this type, the nerves and muscles that empty the rectum do not work normally. What are the causes? Causes of  secondary constipation may include:  Failing to drink enough fluid, eat enough food or fiber, or get physically active.  Pregnancy.  A tear in the anus (anal fissure).  Blockage in the bowel (bowel obstruction).  Narrowing of the bowel (bowel stricture).  Having a long-term medical condition, such as: ? Diabetes. ? Hypothyroidism. ? Multiple sclerosis. ? Parkinson disease. ? Stroke. ? Spinal cord injury. ? Dementia. ? Colon cancer. ? Inflammatory bowel disease (IBD). ? Iron-deficiency anemia. ? Outward collapse of the rectum (rectal prolapse). ? Hemorrhoids.  Taking certain medicines, including: ? Narcotics. These are a certain type of prescription pain medicine. ? Antacids. ? Iron supplements. ? Water pills (diuretics). ? Certain blood pressure medicines. ? Anti-seizure medicines. ? Antidepressants. ? Medicines for Parkinson disease. The cause of functional constipation is not known, but some conditions are associated with it. These conditions include:  Stress.  Problems in the nerves and muscles that control stool transit.  Weak or impaired pelvic floor muscles. What increases the risk? You may be at higher risk for chronic constipation if you:  Are older than age 9.  Are female.  Live in a long-term care facility.  Do not get much exercise or physical activity (have a sedentary lifestyle).  Do not drink enough fluids.  Do not eat enough food, especially fiber.  Have a long-term disease.  Have a mental health disorder or eating disorder.  Take many medicines. What are the signs or symptoms? The main symptom of chronic constipation is having three or fewer bowel movements a week for several weeks. Other signs and  symptoms may vary from person to person. These include:  Pushing hard (straining) to pass stool.  Painful bowel movements.  Having hard or lumpy stools.  Having lower belly discomfort, such as cramps or bloating.  Being unable to  have a bowel movement when you feel the urge.  Feeling like you still need to pass stool after a bowel movement.  Feeling that you have something in your rectum that is blocking or preventing bowel movements.  Seeing blood on the toilet paper or in your stool.  Worsening confusion (in older adults). How is this diagnosed? This condition may be diagnosed based on:  Symptoms and medical history. You will be asked about your symptoms, lifestyle, diet, and any medicines that you are taking.  Physical exam. ? Your belly (abdomen) will be examined. ? A digital rectal exam may be done. For this exam, a health care provider places a lubricated, gloved finger into the rectum.  Other tests to check for any underlying causes of your constipation. These may be ordered if you have bleeding in your rectum, weight loss, or a family history of colon cancer. In these cases, you may have: ? Imaging studies of the colon. These may include X-ray, ultrasound, or CT scan. ? Blood tests. ? A procedure to examine the inside of your colon (colonoscopy). ? More specialized tests to check:  Whether your anal sphincter works well. This is a ring-shaped muscle that controls the closing of the anus.  How well food moves through your colon. ? Tests to measure the nerve signal in your pelvic floor muscles (electromyography). How is this treated? Treatment for chronic constipation depends on the cause. Most often, treatment starts with:  Being more active and getting regular exercise.  Drinking more fluids.  Adding fiber to your diet. Sources of fiber include fruits, vegetables, whole grains, and fiber supplements.  Using medicines such as stool softeners or medicines that increase contractions in your digestive system (pro-motility agents).  Training your pelvic muscles with biofeedback.  Surgery, if there is obstruction. Treatment for secondary chronic constipation depends on the underlying condition. You  may need to:  Stop or change some medicines if they cause constipation.  Use a fiber supplement (bulk laxative) or stool softener.  Use prescription laxative. This works by [employer] into your colon (osmotic laxative). You may also need to see a specialist who treats conditions of the digestive system (gastroenterologist). Follow these instructions at home:   Take over-the-counter and prescription medicines only as told by your health care provider.  If you are taking a laxative, take it as told by your health care provider.  Eat a balanced diet that includes enough fiber. Ask your health care provider to recommend a diet that is right for you.  Drink clear fluids, especially water. Avoid drinking alcohol, caffeine, and soda.  Drink enough fluid to keep your urine pale yellow.  Get some physical activity every day. Ask your health care provider what physical activities are safe for you.  Get colon cancer screenings as told by your health care provider.  Keep all follow-up visits as told by your health care provider. This is important. Contact a health care provider if:  You are having three or fewer bowel movements a week.  Your stools are hard or lumpy.  You notice blood on the toilet paper or in your stool after you have a bowel movement.  You have unexplained weight loss.  You have rectum (rectal) pain.  You have stool  leakage.  You experience nausea or vomiting. Get help right away if:  You have rectal bleeding or you pass blood clots.  You have severe rectal pain.  You have body tissue that pushes out (protrudes) from your anus.  You have severe pain or bloating (distension) in your abdomen.  You have vomiting that you cannot control. Summary  Chronic constipation is a condition in which a person has three or fewer bowel movements a week, for three months or longer.  You may have a higher risk for this condition if you are an older adult, or if you  do not drink enough water or get enough physical activity (are sedentary).  Treatment for this condition depends on the cause. Most treatments for chronic constipation include adding fiber to your diet, drinking more fluids, and getting more physical activity. You may also need to treat any underlying medical conditions or stop or change certain medicines if they cause constipation.  If lifestyle changes do not relieve constipation, your health care provider may recommend taking a laxative. This information is not intended to replace advice given to you by your health care provider. Make sure you discuss any questions you have with your health care provider. Document Revised: 03/23/2017 Document Reviewed: 12/26/2016 Elsevier Patient Education  2020 [employer].

## 2019-05-21 NOTE — Progress Notes (Signed)
Established Patient Office Visit  Subjective:  Patient ID: Theresa Craig, female    DOB: [birth year]  Age: 34 y.o. MRN: [id number]  CC:  Chief Complaint  Patient presents with  . Transitions Of Care  . Constipation    last BM was today and the one before that was Monday.     HPI Theresa Craig presents for    She describes that she has the urge to defecate and nothing comes  She states that she also gets bloating She sometimes gets headaches when she strains She gets small hard stools She gets about 2 BM every 7 days She denies any diarrhea or blood in the stool. She denies history of hemorrhoids.  No retal itching. No pain with BM from the rectum but she gets some abdominal pain.  She denies any use of fiber supplements. She used laxative once in the past which did not work. Patient states that she walks for 30 minutes a day in the neighborhood about a mile She is drinking water, eats, fruits and vegetables She states that she had this for a few years but never paid attention to it.  Gyne history She denies constipation around her menses [id number] with routine OB history and no gestatioal diabetes and she gets a monthly period cycle  She delivered vaginally and did not have complications. She delivered a 9# baby by NSVD.  She denies family history of colon cancer. She takes a B complex but denies calcium  No past medical history on file.  No past surgical history on file.  No family history on file.  Social History   Socioeconomic History  . Marital status: Single    Spouse name: Not on file  . Number of children: Not on file  . Years of education: Not on file  . Highest education level: Not on file  Occupational History  . Not on file  Tobacco Use  . Smoking status: Current Every Day Smoker    Packs/day: 1.00    Types: Cigarettes  . Smokeless tobacco: Never Used  Substance and Sexual Activity  . Alcohol use: Yes    Alcohol/week: 6.0 standard drinks    Types: 4 Glasses of wine, 2 Shots of liquor per week  . Drug use: No  . Sexual activity: Not on file  Other Topics Concern  . Not on file  Social History Narrative  . Not on file   Social Determinants of Health   Financial Resource Strain:   . Difficulty of Paying Living Expenses: Not on file  Food Insecurity:   . Worried About Charity fundraiser in the Last Year: Not on file  . Ran Out of Food in the Last Year: Not on file  Transportation Needs:   . Lack of Transportation (Medical): Not on file  . Lack of Transportation (Non-Medical): Not on file  Physical Activity:   . Days of Exercise per Week: Not on file  . Minutes of Exercise per Session: Not on file  Stress:   . Feeling of Stress : Not on file  Social Connections:   . Frequency of Communication with Friends and Family: Not on file  . Frequency of Social Gatherings with Friends and Family: Not on file  . Attends Religious Services: Not on file  . Active Member of Clubs or Organizations: Not on file  . Attends Archivist Meetings: Not on file  . Marital Status: Not on file  Intimate Partner Violence:   .  Fear of Current or Ex-Partner: Not on file  . Emotionally Abused: Not on file  . Physically Abused: Not on file  . Sexually Abused: Not on file    Outpatient Medications Prior to Visit  Medication Sig Dispense Refill  . benzonatate (TESSALON) 200 MG capsule Take 1 capsule (200 mg total) by mouth 2 (two) times daily as needed for cough. (Patient not taking: Reported on 08/29/2016) 20 capsule 0  . chlorpheniramine-phenylephrine-dextromethorphan (CARDEC DM) 3.5-1-3 MG/ML solution Take 1 mL by mouth every 6 (six) hours as needed for cough. (Patient not taking: Reported on 08/29/2016) 120 mL 0  . clotrimazole-betamethasone (LOTRISONE) cream Apply 1 application topically 2 (two) times daily. 30 g 0  . cyclobenzaprine (FLEXERIL) 5 MG tablet Take 1 tablet (5 mg total) by mouth 3 (three) times daily as needed for muscle  spasms. (Patient not taking: Reported on 03/30/2016) 15 tablet 0  . itraconazole (SPORANOX) 100 MG capsule Take 2 capsules (200 mg total) by mouth 2 (two) times daily. (Patient not taking: Reported on 03/30/2016) 30 capsule 0  . ketoconazole (NIZORAL) 2 % shampoo Apply 1 application topically 2 (two) times a week. 120 mL 0   No facility-administered medications prior to visit.    No Known Allergies  ROS Review of Systems Review of Systems  Constitutional: Negative for activity change, appetite change, chills and fever.  HENT: Negative for congestion, nosebleeds, trouble swallowing and voice change.   Respiratory: Negative for cough, shortness of breath and wheezing.   Gastrointestinal: see hpi Genitourinary: Negative for difficulty urinating, dysuria, flank pain and hematuria.  Musculoskeletal: Negative for back pain, joint swelling and neck pain.  Neurological: Negative for dizziness, speech difficulty, light-headedness and numbness.  See HPI. All other review of systems negative.     Objective:    Physical Exam  BP 108/70   Pulse 76   Temp 98 F (36.7 C) (Temporal)   Ht '5\' 5"'  (1.651 m)   Wt 198 lb (89.8 kg)   LMP 05/07/2019   SpO2 98%   BMI 32.95 kg/m  Wt Readings from Last 3 Encounters:  05/21/19 198 lb (89.8 kg)  08/29/16 208 lb 12.8 oz (94.7 kg)  03/30/16 216 lb (98 kg)   Physical Exam  Constitutional: Oriented to person, place, and time. Appears well-developed and well-nourished.  HENT:  Head: Normocephalic and atraumatic.  Eyes: Conjunctivae and EOM are normal.  Cardiovascular: Normal rate, regular rhythm, normal heart sounds and intact distal pulses.  No murmur heard. Pulmonary/Chest: Effort normal and breath sounds normal. No stridor. No respiratory distress. Has no wheezes.  Abdomen: nondistended, normoactive bs, soft, nontender Neurological: Is alert and oriented to person, place, and time.  Skin: Skin is warm. Capillary refill takes less than 2 seconds.    Psychiatric: Has a normal mood and affect. Behavior is normal. Judgment and thought content normal.    Health Maintenance Due  Topic Date Due  . HIV Screening  05/25/1999  . TETANUS/TDAP  05/25/2003  . PAP SMEAR-Modifier  05/24/2005  . INFLUENZA VACCINE  11/23/2018    There are no preventive care reminders to display for this patient.  No results found for: TSH No results found for: WBC, HGB, HCT, MCV, PLT No results found for: NA, K, CHLORIDE, CO2, GLUCOSE, BUN, CREATININE, BILITOT, ALKPHOS, AST, ALT, PROT, ALBUMIN, CALCIUM, ANIONGAP, EGFR, GFR No results found for: CHOL No results found for: HDL No results found for: LDLCALC No results found for: TRIG No results found for: CHOLHDL No results found  for: HGBA1C    Assessment & Plan:   Problem List Items Addressed This Visit    None    Visit Diagnoses    Chronic constipation    -  Primary  1. Patient will start metamucil and continue walking and hydration 2. Keep a stool diary 3. Will discuss stress as well which can worsen constipation 4. Discussed Kegels to increase pelvic floor strength    Relevant Orders   Ambulatory referral to Gastroenterology   CMP14+EGFR   Screening for thyroid disorder    -  Due to constipation will check thyroid   Relevant Orders   TSH   Encounter to establish care with new doctor    - patient sees Dr. Garwin Brothers for [facility] Shih  We will get a copy of records      No orders of the defined types were placed in this encounter.   Follow-up: No follow-ups on file.    Forrest Moron, MD

## 2019-05-22 LAB — CMP14+EGFR
ALT: 10 IU/L (ref 0–32)
AST: 16 IU/L (ref 0–40)
Albumin/Globulin Ratio: 1.5 (ref 1.2–2.2)
Albumin: 4.1 g/dL (ref 3.8–4.8)
Alkaline Phosphatase: 61 IU/L (ref 39–117)
BUN/Creatinine Ratio: 10 (ref 9–23)
BUN: 7 mg/dL (ref 6–20)
Bilirubin Total: 0.7 mg/dL (ref 0.0–1.2)
CO2: 25 mmol/L (ref 20–29)
Calcium: 9.1 mg/dL (ref 8.7–10.2)
Chloride: 105 mmol/L (ref 96–106)
Creatinine, Ser: 0.68 mg/dL (ref 0.57–1.00)
GFR calc Af Amer: 132 mL/min/{1.73_m2} (ref >59)
GFR calc non Af Amer: 114 mL/min/{1.73_m2} (ref >59)
Globulin, Total: 2.7 g/dL (ref 1.5–4.5)
Glucose: 93 mg/dL (ref 65–99)
Potassium: 4.2 mmol/L (ref 3.5–5.2)
Sodium: 142 mmol/L (ref 134–144)
Total Protein: 6.8 g/dL (ref 6.0–8.5)

## 2019-05-22 LAB — TSH: TSH: 1.75 u[IU]/mL (ref 0.450–4.500)

## 2019-07-07 ENCOUNTER — Encounter: Payer: Self-pay | Admitting: Family Medicine

## 2020-12-02 ENCOUNTER — Other Ambulatory Visit: Payer: Self-pay

## 2020-12-02 ENCOUNTER — Encounter: Payer: Self-pay | Admitting: Podiatry

## 2020-12-02 ENCOUNTER — Ambulatory Visit (INDEPENDENT_AMBULATORY_CARE_PROVIDER_SITE_OTHER): Payer: 59 | Admitting: Podiatry

## 2020-12-02 DIAGNOSIS — B353 Tinea pedis: Secondary | ICD-10-CM | POA: Diagnosis not present

## 2020-12-02 MED ORDER — KETOCONAZOLE 2 % EX CREA: 1.0000 "application " | TOPICAL_CREAM | Freq: Every day | CUTANEOUS | 2 refills | Status: AC

## 2020-12-02 MED ORDER — TERBINAFINE HCL 250 MG PO TABS: 250.0000 mg | ORAL_TABLET | Freq: Every day | ORAL | 0 refills | Status: AC

## 2020-12-02 NOTE — Progress Notes (Signed)
  Subjective:  Patient ID: Theresa Craig, female    DOB: 1985/08/23,  MRN: [id number]  Chief Complaint  Patient presents with   Tinea Pedis      NP - FUNGUS ON FOOT AND ANKLE    35 y.o. female presents with the above complaint. History confirmed with patient.  She has had dry scaly itchy skin on both feet for some time and has been difficult to get rid of it despite using numerous over-the-counter treatments Objective:  Physical Exam: warm, good capillary refill, no trophic changes or ulcerative lesions, normal DP and PT pulses, normal sensory exam, and tinea pedis. Assessment:   1. Tinea pedis of both feet      Plan:  Patient was evaluated and treated and all questions answered.  Discussed the etiology and treatment options for tinea pedis.  Discussed topical and oral treatment.  Recommended topical treatment with 2% ketoconazole cream and 4-week course of oral terbinafine.  This was sent to the patient's pharmacy.  Also discussed appropriate foot hygiene, use of antifungal spray such as Tinactin in shoes, as well as cleaning her foot surfaces such as showers and bathroom floors with bleach.   No follow-ups on file.

## 2020-12-06 ENCOUNTER — Encounter: Payer: Self-pay | Admitting: Podiatry

## 2021-02-23 DIAGNOSIS — L700 Acne vulgaris: Secondary | ICD-10-CM | POA: Insufficient documentation

## 2021-02-23 DIAGNOSIS — L819 Disorder of pigmentation, unspecified: Secondary | ICD-10-CM | POA: Insufficient documentation

## 2021-06-09 ENCOUNTER — Ambulatory Visit: Payer: Self-pay

## 2022-11-10 ENCOUNTER — Encounter: Payer: Self-pay | Admitting: Internal Medicine

## 2022-11-10 ENCOUNTER — Ambulatory Visit: Payer: Self-pay | Attending: Internal Medicine | Admitting: Internal Medicine

## 2022-11-10 VITALS — BP 100/78 | HR 69 | Temp 98.4°F | Ht 65.0 in | Wt 215.0 lb

## 2022-11-10 DIAGNOSIS — R079 Chest pain, unspecified: Secondary | ICD-10-CM | POA: Insufficient documentation

## 2022-11-10 DIAGNOSIS — F3281 Premenstrual dysphoric disorder: Secondary | ICD-10-CM

## 2022-11-10 DIAGNOSIS — E669 Obesity, unspecified: Secondary | ICD-10-CM

## 2022-11-10 DIAGNOSIS — G43829 Menstrual migraine, not intractable, without status migrainosus: Secondary | ICD-10-CM | POA: Insufficient documentation

## 2022-11-10 DIAGNOSIS — Z7689 Persons encountering health services in other specified circumstances: Secondary | ICD-10-CM

## 2022-11-10 DIAGNOSIS — F17290 Nicotine dependence, other tobacco product, uncomplicated: Secondary | ICD-10-CM

## 2022-11-10 DIAGNOSIS — Z6835 Body mass index (BMI) 35.0-35.9, adult: Secondary | ICD-10-CM

## 2022-11-10 MED ORDER — SUMATRIPTAN SUCCINATE 50 MG PO TABS: ORAL_TABLET | ORAL | 1 refills | Status: DC

## 2022-11-10 NOTE — Patient Instructions (Signed)

## 2022-11-10 NOTE — Progress Notes (Signed)
Patient ID: [name], female    DOB: 10-22-1985  MRN: [id number]  CC: Establish Care (Est care / new pt. /Intermittent R chest pains X1 yr/)   Subjective: Theresa Craig is a 37 y.o. female who presents for new pt visits Her concerns today include:   Pt to est care.  Previous PCP was Dr. Collie Craig at [facility] whom she saw once in 2021; facility has closed.  No chronic med issues.  No rxn meds  Reports CP intermittently x 1 yr. Usually when stressed; last CP episode 3 days ago -located on RT side and associated with difficulty taking in deep breath.  Had radiation down the arm in past but not recently.   -pressure like; can last a day. -goes away if she takes [facility] Powder.   -no fhx of HD -currently smokes 1.5 cigars on wkend. Would like to quit.  Thinks she will quit cold [place].  Hx of migraines Referred to neurologist in [place] by her GYN but did not go Occurs mainly when menstrual cycles are about to start. All over head, very disabling.  Endorses N/V, blurred vision at times, + photophobia, throbbing. Better laying down.  Tried Excedrine, Tylenol, Bcs, Goody Powder.  Do not help.    Was on Zoloft from Dec 2023-March for PMDS. Dep, anx and angry around time of menses Found it helpful but stopped taking because it dec libido So far not bad being off med.  Obesity:  was walk regularly but stopped.  HM: Last pap, HIV and hep c screenings 03/2022 by Dr. Cherly Craig Patient Active Problem List   Diagnosis Date Noted   UTI (lower urinary tract infection) 12/17/2013   Bilateral low back pain without sciatica 12/17/2013     Current Outpatient Medications on File Prior to Visit  Medication Sig Dispense Refill   ketoconazole (NIZORAL) 2 % cream Apply 1 application topically daily. (Patient not taking: Reported on 11/10/2022) 60 g 2   Vitamin D, Ergocalciferol, (DRISDOL) 1.25 MG (50000 UNIT) CAPS capsule Take 50,000 Units by mouth once a week. (Patient not taking:  Reported on 11/10/2022)     No current facility-administered medications on file prior to visit.    No Known Allergies  Social History   Socioeconomic History   Marital status: Significant Other    Spouse name: Not on file   Number of children: 1   Years of education: Not on file   Highest education level: Bachelor's degree (e.g., BA, AB, BS)  Occupational History   Occupation: Environmental health practitioner  Tobacco Use   Smoking status: Every Day    Types: Cigars   Smokeless tobacco: Never  Vaping Use   Vaping status: Never Used  Substance and Sexual Activity   Alcohol use: Not Currently    Comment: one drink on weekends   Drug use: No   Sexual activity: Not on file  Other Topics Concern   Not on file  Social History Narrative   Not on file   Social Determinants of Health   Financial Resource Strain: Not on file  Food Insecurity: Low Risk  (07/05/2022)   Received from [facility]   Food vital sign    Within the past 12 months, you worried that your food would run out before you got money to buy more: Never true    Within the past 12 months, the food you bought just didn't last and you didn't have money to get more. : Never true  Transportation Needs:  Not on file (07/05/2022)  Physical Activity: Not on file  Stress: Not on file  Social Connections: Not on file  Intimate Partner Violence: Not on file    Family History  Problem Relation Age of Onset   Diabetes Paternal Grandmother     Past Surgical History:  Procedure Laterality Date   NO PAST SURGERIES      ROS: Review of Systems Negative except as stated above  PHYSICAL EXAM: BP 100/78 (BP Location: Left Arm, Patient Position: Sitting, Cuff Size: Normal)   Pulse 69   Temp 98.4 F (36.9 C) (Oral)   Ht 5\' 5"  (1.651 m)   Wt 215 lb (97.5 kg)   SpO2 99%   BMI 35.78 kg/m   Physical Exam  General appearance - alert, well appearing, and in no distress Neck - supple, no significant adenopathy Chest - clear  to auscultation, no wheezes, rales or rhonchi, symmetric air entry Heart - normal rate, regular rhythm, normal S1, S2, no murmurs, rubs, clicks or gallops Neurological - cranial nerves II through XII intact, motor and sensory grossly normal bilaterally.  Nl gait Extremities - peripheral pulses normal, no pedal edema, no clubbing or cyanosis     11/10/2022    9:19 AM 05/21/2019    1:35 PM 08/29/2016    5:44 PM  Depression screen PHQ 2/9  Decreased Interest 0 0 0  Down, Depressed, Hopeless 1 0 0  PHQ - 2 Score 1 0 0  Altered sleeping 2    Tired, decreased energy 2    Change in appetite 1    Feeling bad or failure about yourself  0    Trouble concentrating 1    Moving slowly or fidgety/restless 1    Suicidal thoughts 0    PHQ-9 Score 8        11/10/2022    9:19 AM  GAD 7 : Generalized Anxiety Score  Nervous, Anxious, on Edge 0  Control/stop worrying 0  Worry too much - different things 0  Trouble relaxing 0  Restless 0  Easily annoyed or irritable 0  Afraid - awful might happen 0  Total GAD 7 Score 0        Latest Ref Rng & Units 05/21/2019    2:24 PM  CMP  Glucose 65 - 99 mg/dL 93   BUN 6 - 20 mg/dL 7   Creatinine 1.61 - 0.96 mg/dL 0.45   Sodium 409 - 811 mmol/L 142   Potassium 3.5 - 5.2 mmol/L 4.2   Chloride 96 - 106 mmol/L 105   CO2 20 - 29 mmol/L 25   Calcium 8.7 - 10.2 mg/dL 9.1   Total Protein 6.0 - 8.5 g/dL 6.8   Total Bilirubin 0.0 - 1.2 mg/dL 0.7   Alkaline Phos 39 - 117 IU/L 61   AST 0 - 40 IU/L 16   ALT 0 - 32 IU/L 10    Lipid Panel  No results found for: "CHOL", "TRIG", "HDL", "CHOLHDL", "VLDL", "LDLCALC", "LDLDIRECT"  CBC No results found for: "WBC", "RBC", "HGB", "HCT", "PLT", "MCV", "MCH", "MCHC", "RDW", "LYMPHSABS", "MONOABS", "EOSABS", "BASOSABS"  ASSESSMENT AND PLAN: 1. Establishing care with new doctor, encounter for   2. Chest pain in adult Occurs when stressed.  Risk factors for  HD is smoking.  Advised to quit.  Will have her see  cardiology - Ambulatory referral to Cardiology  3. Menstrual migraine without status migrainosus, not intractable Start Imitrex to take dialy for 1-2 days prior to start of menses.  No more than 2 tabs/24 hr - SUMAtriptan (IMITREX) 50 MG tablet; 1 tab PO daily 1-2 days prior to start of menses.  May repeat in 2 hrs.  Max2 tabs/24 hr  Dispense: 10 tablet; Refill: 1  4. PMDD (premenstrual dysphoric disorder) Declines being tried with another med at this time.  5. Cigar smoker Pt is current smoker. Patient advised to quit smoking. Discussed health risks associated with smoking including lung and other types of cancers, chronic lung diseases and CV risks.. Pt ready to give trail of quitting.   Discussed methods to help quit  Pt wanting to try: cold [place].  Advise to set quit date _1_ Minutes spent on counseling. F/U:  reassess on subsequent visit   6. Obesity (BMI 35.0-39.9 without comorbidity) Patient advised to eliminate sugary drinks from the diet, cut back on portion sizes especially of white carbohydrates, eat more white lean meat like chicken [place] and seafood instead of beef or pork and incorporate fresh fruits and vegetables into the diet daily. -encourage her to get in some form of moderate intensity exercise several days a wk   Patient was given the opportunity to ask questions.  Patient verbalized understanding of the plan and was able to repeat key elements of the plan.   This documentation was completed using Paediatric nurse.  Any transcriptional errors are unintentional.  Orders Placed This Encounter  Procedures   Ambulatory referral to Cardiology     Requested Prescriptions   Signed Prescriptions Disp Refills   SUMAtriptan (IMITREX) 50 MG tablet 10 tablet 1    Sig: 1 tab PO daily 1-2 days prior to start of menses.  May repeat in 2 hrs.  Max2 tabs/24 hr    Return for Please sign a release to get PAP and HIV/Hepc results from Dr.  Cherly Craig.  Jonah Blue, MD, FACP

## 2022-12-29 ENCOUNTER — Telehealth: Payer: Self-pay | Admitting: Internal Medicine

## 2022-12-29 NOTE — Telephone Encounter (Signed)
Copied from CRM [id number]. Topic: General - Other >> Dec 29, 2022  9:22 AM Dondra Prader A wrote: Reason for CRM:  Pt is calling to see if the office received a fax sent over yesterday afternoon for a chart summary for her PCP to fill out for her employer. Please call the pt back to discuss.

## 2023-01-01 NOTE — Telephone Encounter (Signed)
Make sure she takes form with her so that provider at the mobile unit can see what blood test need to be ordered.

## 2023-01-01 NOTE — Telephone Encounter (Signed)
Theresa Craig & spoke to the patient. Verified name & DOB. Informed that a physical appointment is needed in order to complete forms. Patient stated that forms need to be completed by 01/22/2023. PCP currently does not have any available appointments before that date. Instructed patient to go to the mobile clinic. Patient stated that she will be there on 01/10/23, address and hours were provided. Patient expressed verbal understanding of all discussed.

## 2023-01-01 NOTE — Telephone Encounter (Signed)
Called & spoke to the patient. Verified name & DOB. Informed that forms have been received and placed in provider basket.  Informed that we will call to let her know when forms are ready for pick-up. Patient expressed verbal understanding. Please let me know when forms are ready.

## 2023-01-03 NOTE — Telephone Encounter (Signed)
Called & spoke to the patient. Verified name & DOB. Patient stated that she is able to get her own copy and will take to the mobile screening bus. No further assistance required at this time.

## 2023-01-25 ENCOUNTER — Ambulatory Visit: Payer: Self-pay | Attending: Cardiology | Admitting: Cardiology

## 2023-02-02 ENCOUNTER — Ambulatory Visit
Admission: RE | Admit: 2023-02-02 | Discharge: 2023-02-02 | Disposition: A | Discharge status: Home / Self Care | Payer: 59 | Source: Ambulatory Visit

## 2023-02-02 ENCOUNTER — Ambulatory Visit: Payer: Self-pay

## 2023-02-02 ENCOUNTER — Ambulatory Visit: Payer: 59

## 2023-02-02 VITALS — BP 100/67 | HR 65 | Temp 97.8°F | Resp 18 | Ht 65.0 in | Wt 210.0 lb

## 2023-02-02 DIAGNOSIS — M5442 Lumbago with sciatica, left side: Secondary | ICD-10-CM

## 2023-02-02 LAB — POCT URINALYSIS DIP (MANUAL ENTRY)
Bilirubin, UA: NEGATIVE
Glucose, UA: NEGATIVE mg/dL
Ketones, POC UA: NEGATIVE mg/dL
Leukocytes, UA: NEGATIVE
Nitrite, UA: NEGATIVE
Protein Ur, POC: NEGATIVE mg/dL
Spec Grav, UA: 1.02 (ref 1.010–1.025)
Urobilinogen, UA: 1 U/dL
pH, UA: 7 (ref 5.0–8.0)

## 2023-02-02 MED ORDER — CYCLOBENZAPRINE HCL 10 MG PO TABS: 10.0000 mg | ORAL_TABLET | Freq: Every day | ORAL | 0 refills | Status: AC

## 2023-02-02 MED ORDER — PREDNISONE 20 MG PO TABS: 40.0000 mg | ORAL_TABLET | Freq: Every day | ORAL | 0 refills | Status: AC

## 2023-02-02 NOTE — Discharge Instructions (Signed)
Once the  x-ray of you low back has resulted you will be notified via my chart of any additional follow-up warranted after review of x-rays.  Take medication as prescribed.  As discussed cyclobenzaprine as a muscle relaxer take at bedtime as this medication can cause drowsiness however can be effective in alleviating pain related to low back pain.

## 2023-02-02 NOTE — ED Triage Notes (Addendum)
possible pinched nerve, tingling LUE, LLE - Entered by patient  Additional details: "I have this pain in left hip/buttocks, Tuesday during walking/jogging started feeling uncomfortable then pain in lower left side of back (pants line) extending down to left buttocks and hip". Today "I feel tingling in my left arm and left leg". No chest pain. No sob. "Hurts to pee when I first try and start going". Stools "normal" (not hard or loose). No known/obvious injury.

## 2023-02-02 NOTE — Telephone Encounter (Signed)
Noted  

## 2023-02-02 NOTE — Telephone Encounter (Signed)
  Chief Complaint: tingling Symptoms: tingling numbness LUE, LLE, back pain L sided, L hip pain Frequency: back and hip pain Tuesday after jogging, tingling numbness today  Pertinent Negatives: Patient denies dizziness, numbness in face or change in speech  Disposition: [] ED /[x] Urgent Care (no appt availability in office) / [] Appointment(In office/virtual)/ []  [facility]/ [] Home Care/ [] Refused Recommended Disposition /[] [facility]/ []  Follow-up with PCP Additional Notes: pt states she was walking/jogging Tuesday and felt L hip and back pain. Today she is working sitting down and felt the numbness in LUE and LLE. Able to use LUE like normal but feels like gait is different in LLE. Pt states she also feels a knot in her back. No appts until 02/22/23, advised UC and scheduled pt appt today at 1230.   Summary: Left arm and lef leg pain   Patient stated she has been working out and now she has tingling and pain going down left arm and left leg.         Reason for Disposition  [1] Tingling (e.g., pins and needles) of the face, arm / hand, or leg / foot on one side of the body AND [2] present now (Exceptions: Chronic or recurrent symptom lasting > 4 weeks; or tingling from known cause, such as: bumped elbow, carpal tunnel syndrome, pinched nerve, frostbite.)  Answer Assessment - Initial Assessment Questions 1. SYMPTOM: "What is the main symptom you are concerned about?" (e.g., weakness, numbness)     Tingling and numbness 2. ONSET: "When did this start?" (minutes, hours, days; while sleeping)     Tingling today, pain Tuesday  4. PATTERN "Does this come and go, or has it been constant since it started?"  "Is it present now?"     Constant  6. NEUROLOGIC SYMPTOMS: "Have you had any of the following symptoms: headache, dizziness, vision loss, double vision, changes in speech, unsteady on your feet?"     Leg pain unsteady  7. OTHER SYMPTOMS: "Do you have any other  symptoms?"     Hip pain d/t possible injury with jogging, back pain L sided  Protocols used: Neurologic Deficit-A-AH

## 2023-02-02 NOTE — ED Provider Notes (Signed)
[facility]    CSN: [id number] Arrival date & time: 02/02/23  1227      History   Chief Complaint Chief Complaint  Patient presents with   Back Pain    HPI Theresa Craig is a 37 y.o. female.   The history is provided by the patient.  Back Pain Location:  Lumbar spine Quality:  Shooting Radiates to:  L posterior upper leg (Left leg) Pain severity:  Moderate Onset quality:  Sudden Duration:  4 days Timing:  Constant Progression:  Worsening Chronicity:  Recurrent Worsened by:  Movement, lying down and sitting Ineffective treatments:  OTC medications Associated symptoms: numbness and paresthesias     History reviewed. No pertinent past medical history.  Patient Active Problem List   Diagnosis Date Noted   Obesity (BMI 35.0-39.9 without comorbidity) 11/10/2022   PMDD (premenstrual dysphoric disorder) 11/10/2022   Cigar smoker 11/10/2022   Menstrual migraine without status migrainosus, not intractable 11/10/2022   Chest pain in adult 11/10/2022   Acne vulgaris 02/23/2021   Hyperpigmentation 02/23/2021   Lower urinary tract infectious disease 12/17/2013   Bilateral low back pain without sciatica 12/17/2013    Past Surgical History:  Procedure Laterality Date   NO PAST SURGERIES      OB History   No obstetric history on file.      Home Medications    Prior to Admission medications   Medication Sig Start Date End Date Taking? Authorizing Provider  acetaminophen (PAIN RELIEVER) 325 MG tablet Take 650 mg by mouth every 6 (six) hours as needed. Last dose: yesterday.   Yes [provider]  cyclobenzaprine (FLEXERIL) 10 MG tablet Take 1 tablet (10 mg total) by mouth at bedtime. 02/02/23  Yes Bing Neighbors, NP  predniSONE (DELTASONE) 20 MG tablet Take 2 tablets (40 mg total) by mouth daily with breakfast. 02/02/23  Yes Bing Neighbors, NP  tretinoin (RETIN-A) 0.025 % cream Apply 1 Application topically at bedtime. 02/23/21  Yes  [provider]  ketoconazole (NIZORAL) 2 % cream Apply 1 application topically daily. Patient not taking: Reported on 11/10/2022 12/02/20   Edwin Cap, DPM  SUMAtriptan (IMITREX) 50 MG tablet 1 tab PO daily 1-2 days prior to start of menses.  May repeat in 2 hrs.  Max2 tabs/24 hr 11/10/22   Marcine Matar, MD  Vitamin D, Ergocalciferol, (DRISDOL) 1.25 MG (50000 UNIT) CAPS capsule Take 50,000 Units by mouth once a week. Patient not taking: Reported on 11/10/2022 08/01/20   [provider]    Family History Family History  Problem Relation Age of Onset   Diabetes Paternal Grandmother     Social History Social History   Tobacco Use   Smoking status: Every Day    Types: Cigars   Smokeless tobacco: Never  Vaping Use   Vaping status: Never Used  Substance Use Topics   Alcohol use: Yes    Comment: one drink on weekends   Drug use: Never     Allergies   Patient has no known allergies.   Review of Systems Review of Systems  Musculoskeletal:  Positive for back pain.  Neurological:  Positive for numbness and paresthesias.     Physical Exam Triage Vital Signs ED Triage Vitals  Encounter Vitals Group     BP 02/02/23 1240 100/67     Systolic BP Percentile --      Diastolic BP Percentile --      Pulse Rate 02/02/23 1240 65  Resp 02/02/23 1240 18     Temp 02/02/23 1240 97.8 F (36.6 C)     Temp Source 02/02/23 1240 Oral     SpO2 02/02/23 1240 97 %     Weight 02/02/23 1236 210 lb (95.3 kg)     Height 02/02/23 1236 5\' 5"  (1.651 m)     Head Circumference --      Peak Flow --      Pain Score 02/02/23 1230 7     Pain Loc --      Pain Education --      Exclude from Growth Chart --    No data found.  Updated Vital Signs BP 100/67 (BP Location: Right Arm)   Pulse 65   Temp 97.8 F (36.6 C) (Oral)   Resp 18   Ht 5\' 5"  (1.651 m)   Wt 210 lb (95.3 kg)   LMP 01/31/2023 (Exact Date)   SpO2 97%   BMI 34.95 kg/m   Visual Acuity Right Eye  Distance:   Left Eye Distance:   Bilateral Distance:    Right Eye Near:   Left Eye Near:    Bilateral Near:     Physical Exam Vitals reviewed.  Constitutional:      Appearance: Normal appearance.  HENT:     Head: Normocephalic and atraumatic.  Eyes:     Extraocular Movements: Extraocular movements intact.     Pupils: Pupils are equal, round, and reactive to light.  Cardiovascular:     Rate and Rhythm: Normal rate and regular rhythm.  Pulmonary:     Effort: Pulmonary effort is normal.     Breath sounds: Normal breath sounds.  Musculoskeletal:     Lumbar back: Tenderness present. Decreased range of motion. Positive left straight leg raise test.  Skin:    General: Skin is warm and dry.     Capillary Refill: Capillary refill takes less than 2 seconds.  Neurological:     General: No focal deficit present.     Mental Status: She is alert.      UC Treatments / Results  Labs (all labs ordered are listed, but only abnormal results are displayed) Labs Reviewed  POCT URINALYSIS DIP (MANUAL ENTRY) - Abnormal; Notable for the following components:      Result Value   Blood, UA moderate (*)    All other components within normal limits    EKG   Radiology DG Lumbar Spine Complete  Result Date: 02/02/2023 CLINICAL DATA:  Low back pain with radiculopathy EXAM: LUMBAR SPINE - COMPLETE 4+ VIEW COMPARISON:  None Available. FINDINGS: There is no evidence of lumbar spine fracture. Alignment is normal. Intervertebral disc spaces are maintained. Facet joints within normal limits. IMPRESSION: Negative. Electronically Signed   By: Duanne Guess D.O.   On: 02/02/2023 15:38    Procedures Procedures (including critical care time)  Medications Ordered in UC Medications - No data to display  Initial Impression / Assessment and Plan / UC Course  I have reviewed the triage vital signs and the nursing notes.  Pertinent labs & imaging results that were available during my care of the  patient were reviewed by me and considered in my medical decision making (see chart for details).   Acute left-sided low back pain with left sided sciatica, no cauda equina symptoms present on exam.  Urinalysis unremarkable therefore low suspicion for UTI causing back pain.  Offered Toradol and Decadron injection patient declined.  Will treat with oral prednisone and Flexeril.  Return  precautions given to symptoms worsen or do not improve. Obtained lumbar spine plain film imaging advised patient would notify her via the chart of the results. Patient verbalized understanding and agreement with plan. Final Clinical Impressions(s) / UC Diagnoses   Final diagnoses:  Acute left-sided low back pain with left-sided sciatica     Discharge Instructions      Once the  x-ray of you low back has resulted you will be notified via my chart of any additional follow-up warranted after review of x-rays.  Take medication as prescribed.  As discussed cyclobenzaprine as a muscle relaxer take at bedtime as this medication can cause drowsiness however can be effective in alleviating pain related to low back pain.     ED Prescriptions     Medication Sig Dispense Auth. Provider   predniSONE (DELTASONE) 20 MG tablet Take 2 tablets (40 mg total) by mouth daily with breakfast. 10 tablet Bing Neighbors, NP   cyclobenzaprine (FLEXERIL) 10 MG tablet Take 1 tablet (10 mg total) by mouth at bedtime. 12 tablet Bing Neighbors, NP      PDMP not reviewed this encounter.   Bing Neighbors, NP 02/02/23 [phone]

## 2023-04-04 LAB — HM PAP SMEAR: HPV, high-risk: NEGATIVE

## 2024-01-16 ENCOUNTER — Telehealth: Payer: Self-pay | Admitting: Internal Medicine

## 2024-01-16 NOTE — Telephone Encounter (Signed)
 Lvm to confirm appt for 9/25

## 2024-01-17 ENCOUNTER — Encounter: Payer: Self-pay | Admitting: Internal Medicine

## 2024-01-17 ENCOUNTER — Ambulatory Visit: Attending: Family Medicine | Admitting: Internal Medicine

## 2024-01-17 VITALS — BP 99/63 | HR 86 | Temp 98.4°F | Ht 65.0 in | Wt 194.0 lb

## 2024-01-17 DIAGNOSIS — E6609 Other obesity due to excess calories: Secondary | ICD-10-CM

## 2024-01-17 DIAGNOSIS — Z0000 Encounter for general adult medical examination without abnormal findings: Secondary | ICD-10-CM

## 2024-01-17 DIAGNOSIS — J069 Acute upper respiratory infection, unspecified: Secondary | ICD-10-CM

## 2024-01-17 DIAGNOSIS — H538 Other visual disturbances: Secondary | ICD-10-CM

## 2024-01-17 DIAGNOSIS — F17200 Nicotine dependence, unspecified, uncomplicated: Secondary | ICD-10-CM | POA: Diagnosis not present

## 2024-01-17 DIAGNOSIS — Z2821 Immunization not carried out because of patient refusal: Secondary | ICD-10-CM

## 2024-01-17 DIAGNOSIS — Z6832 Body mass index (BMI) 32.0-32.9, adult: Secondary | ICD-10-CM

## 2024-01-17 DIAGNOSIS — E66811 Obesity, class 1: Secondary | ICD-10-CM

## 2024-01-17 DIAGNOSIS — E559 Vitamin D deficiency, unspecified: Secondary | ICD-10-CM

## 2024-01-17 DIAGNOSIS — G43829 Menstrual migraine, not intractable, without status migrainosus: Secondary | ICD-10-CM

## 2024-01-17 DIAGNOSIS — Z23 Encounter for immunization: Secondary | ICD-10-CM | POA: Diagnosis not present

## 2024-01-17 DIAGNOSIS — K5900 Constipation, unspecified: Secondary | ICD-10-CM

## 2024-01-17 MED ORDER — SUMATRIPTAN SUCCINATE 50 MG PO TABS: ORAL_TABLET | ORAL | 1 refills | Status: AC

## 2024-01-17 MED ORDER — NICOTINE POLACRILEX 2 MG MT GUM: CHEWING_GUM | OROMUCOSAL | 4 refills | Status: AC

## 2024-01-17 NOTE — Progress Notes (Signed)
 Patient ID: Theresa Craig, female    DOB: 11-09-1985  MRN: [id number]  CC: Employment Physical (Physical. Thompson vit D [place]old X4 days /Flu vax administered on 01/17/24 - C.A./No to Pneumonia & tdap vax. Instructed to sign ROI for pap. )   Subjective: Theresa Craig is a 38 y.o. female who presents for physical Her concerns today include:  Patient with history of menstrual migraines, PMDD, tobacco dependence, obesity  Discussed the use of AI scribe software for clinical note transcription with the patient, who gave verbal consent to proceed.  History of Present Illness Theresa Craig is a 38 year old who presents for an annual physical exam.  HM: Yes to flu vaccine today.  Does not desire COVID booster or hepatitis B vaccine series.  She is not sure whether she has had the HPV series and wants to hold on that for now.  Had Pap smear with her gynecologist Dr. Rutherford last year or this year.  She will sign a release for us  to get the report.  She has a history of low vitamin D  levels, identified during a dermatology visit in May, with a level noted to be 24. She takes 400 IU of vitamin D  daily but remains concerned about her levels.  She has experienced a cough for four days, which is improving but still produces clear mucus. No shortness of breath or fever. She has been using Mucinex for symptom relief.  She smokes two cigars a day and would like to quit.  She drinks alcohol socially, approximately one drink every two weeks, and denies any substance use.  She has lost weight, currently weighing 194 pounds, down from 215 pounds last year. She attributes this to dietary changes and increased physical activity, including walking 30 minutes a day, four to five times a week.  Weaknesses sugary snacks in particular candies and chocolate..  She experiences migraines around her menstrual cycle and uses Imitrex  for relief, which she reports is effective.  Requests refill.  She reports some  vision issues, describing things as 'foggy' and sensitivity to light at night, but has never had an eye exam.    Patient Active Problem List   Diagnosis Date Noted   Obesity (BMI 35.0-39.9 without comorbidity) 11/10/2022   PMDD (premenstrual dysphoric disorder) 11/10/2022   Cigar smoker 11/10/2022   Menstrual migraine without status migrainosus, not intractable 11/10/2022   Chest pain in adult 11/10/2022   Acne vulgaris 02/23/2021   Hyperpigmentation 02/23/2021   Lower urinary tract infectious disease 12/17/2013   Bilateral low back pain without sciatica 12/17/2013     Current Outpatient Medications on File Prior to Visit  Medication Sig Dispense Refill   acetaminophen (PAIN RELIEVER) 325 MG tablet Take 650 mg by mouth every 6 (six) hours as needed. Last dose: yesterday.     cyclobenzaprine  (FLEXERIL ) 10 MG tablet Take 1 tablet (10 mg total) by mouth at bedtime. 12 tablet 0   ketoconazole  (NIZORAL ) 2 % cream Apply 1 application topically daily. (Patient not taking: Reported on 01/17/2024) 60 g 2   predniSONE  (DELTASONE ) 20 MG tablet Take 2 tablets (40 mg total) by mouth daily with breakfast. (Patient not taking: Reported on 01/17/2024) 10 tablet 0   tretinoin (RETIN-A) 0.025 % cream Apply 1 Application topically at bedtime. (Patient not taking: Reported on 01/17/2024)     Vitamin D , Ergocalciferol , (DRISDOL) 1.25 MG (50000 UNIT) CAPS capsule Take 50,000 Units by mouth once a week. (Patient not taking: Reported on 01/17/2024)  No current facility-administered medications on file prior to visit.    No Known Allergies  Social History   Socioeconomic History   Marital status: Significant Other    Spouse name: Not on file   Number of children: 1   Years of education: Not on file   Highest education level: Bachelor's degree (e.g., BA, AB, BS)  Occupational History   Occupation: Environmental health practitioner  Tobacco Use   Smoking status: Every Day    Types: Cigars   Smokeless tobacco:  Never  Vaping Use   Vaping status: Never Used  Substance and Sexual Activity   Alcohol use: Yes    Comment: one drink on weekends   Drug use: Never   Sexual activity: Not Currently  Other Topics Concern   Not on file  Social History Narrative   Not on file   Social Drivers of Health   Financial Resource Strain: Low Risk  (01/17/2024)   Overall Financial Resource Strain (CARDIA)    Difficulty of Paying Living Expenses: Not very hard  Food Insecurity: No Food Insecurity (01/17/2024)   Hunger Vital Sign    Worried About Running Out of Food in the Last Year: Never true    Ran Out of Food in the Last Year: Never true  Transportation Needs: No Transportation Needs (01/17/2024)   PRAPARE - Administrator, Civil Service (Medical): No    Lack of Transportation (Non-Medical): No  Physical Activity: Insufficiently Active (01/17/2024)   Exercise Vital Sign    Days of Exercise per Week: 3 days    Minutes of Exercise per Session: 30 min  Stress: No Stress Concern Present (01/17/2024)   [employer] of Occupational Health - Occupational Stress Questionnaire    Feeling of Stress: Only a little  Social Connections: Moderately Integrated (01/17/2024)   Social Connection and Isolation Panel    Frequency of Communication with Friends and Family: Three times a week    Frequency of Social Gatherings with Friends and Family: Three times a week    Attends Religious Services: 1 to 4 times per year    Active Member of Clubs or Organizations: Yes    Attends Banker Meetings: 1 to 4 times per year    Marital Status: Never married  Intimate Partner Violence: Not At Risk (01/17/2024)   Humiliation, Afraid, Rape, and Kick questionnaire    Fear of Current or Ex-Partner: No    Emotionally Abused: No    Physically Abused: No    Sexually Abused: No    Family History  Problem Relation Age of Onset   Diabetes Paternal Grandmother     Past Surgical History:  Procedure  Laterality Date   NO PAST SURGERIES      ROS: Review of Systems  HENT:  Negative for dental problem, hearing loss and trouble swallowing.   Respiratory:  Negative for shortness of breath.   Cardiovascular:  Negative for chest pain.  Gastrointestinal:  Positive for constipation (does have constipation intermittently). Negative for abdominal pain.  Musculoskeletal:  Negative for arthralgias.   PHYSICAL EXAM: BP 99/63 (BP Location: Left Arm, Patient Position: Sitting, Cuff Size: Normal)   Pulse 86   Temp 98.4 F (36.9 C) (Oral)   Ht 5' 5 (1.651 m)   Wt 194 lb (88 kg)   SpO2 98%   BMI 32.28 kg/m   Wt Readings from Last 3 Encounters:  01/17/24 194 lb (88 kg)  02/02/23 210 lb (95.3 kg)  11/10/22 215 lb (97.5 kg)  Physical Exam  General appearance - alert, well appearing, and in no distress Mental status - normal mood, behavior, speech, dress, motor activity, and thought processes Eyes - pupils equal and reactive, extraocular eye movements intact Ears - bilateral TM's and external ear canals normal Nose -mild enlargement of nasal turbinates left greater than right. Mouth - mucous membranes moist, pharynx normal without lesions Neck - supple, no significant adenopathy Lymphatics - no palpable lymphadenopathy, no hepatosplenomegaly Chest - clear to auscultation, no wheezes, rales or rhonchi, symmetric air entry Heart - normal rate, regular rhythm, normal S1, S2, no murmurs, rubs, clicks or gallops Abdomen - soft, nontender, nondistended, no masses or organomegaly Neurological -cranial nerves are grossly intact.  She is actively moving all 4 extremities.  Gait appears normal.   Musculoskeletal - no joint tenderness, deformity or swelling Extremities - peripheral pulses normal, no pedal edema, no clubbing or cyanosis      Latest Ref Rng & Units 05/21/2019    2:24 PM  CMP  Glucose 65 - 99 mg/dL 93   BUN 6 - 20 mg/dL 7   Creatinine 9.42 - 8.99 mg/dL 9.31   Sodium 865 - 855  mmol/L 142   Potassium 3.5 - 5.2 mmol/L 4.2   Chloride 96 - 106 mmol/L 105   CO2 20 - 29 mmol/L 25   Calcium 8.7 - 10.2 mg/dL 9.1   Total Protein 6.0 - 8.5 g/dL 6.8   Total Bilirubin 0.0 - 1.2 mg/dL 0.7   Alkaline Phos 39 - 117 IU/L 61   AST 0 - 40 IU/L 16   ALT 0 - 32 IU/L 10    Lipid Panel  No results found for: CHOL, TRIG, HDL, CHOLHDL, VLDL, LDLCALC, LDLDIRECT  CBC No results found for: WBC, RBC, HGB, HCT, PLT, MCV, MCH, MCHC, RDW, LYMPHSABS, MONOABS, EOSABS, BASOSABS  ASSESSMENT AND PLAN: 1. Annual physical exam (Primary) Advised of biannual dental cleaning. Advised of getting an eye exam at least once every 2 years. Sign release for us  to get Pap smear report from her gynecologist. Flu shot given today.  2. Viral upper respiratory tract infection She reports that she is doing better compared to when symptoms first started.  Recommend conservative management.  Can try Robitussin over-the-counter as needed for the cough.  3. Tobacco dependence Strongly advised to quit.  She thinks she would like to give a trial of quitting.  We discussed methods to help her quit including replacement therapy or Chantix.  She wants to try just the nicotine  gum for now. - nicotine  polacrilex (NICORETTE ) 2 MG gum; Chew 1-2 pieces as needed. No more than 30 pieces/24 hr  Dispense: 100 tablet; Refill: 4  4. Class 1 obesity due to excess calories without serious comorbidity with body mass index (BMI) of 32.0 to 32.9 in adult Commended her on weight loss.  Encouraged her to continue regular exercise. Patient advised to eliminate sugary drinks from the diet, cut back on portion sizes especially of white carbohydrates, eat more white lean meat like chicken [place] and seafood instead of beef or pork and incorporate fresh fruits and vegetables into the diet daily.  5. Menstrual migraine without status migrainosus, not intractable - SUMAtriptan  (IMITREX ) 50 MG  tablet; 1 tab PO daily 1-2 days prior to start of menses.  May repeat in 2 hrs.  Max2 tabs/24 hr  Dispense: 10 tablet; Refill: 1  6. Vitamin D  deficiency We will recheck vitamin D  level today to see whether she needs to be on  a higher dose than the 400 international units that she states she is taking daily - VITAMIN D  25 Hydroxy (Vit-D Deficiency, Fractures)  7. Constipation, unspecified constipation type Advised to get fiber in the diet daily.  Few vegetables and fruits are high in fiber.  Can also use MiraLAX as needed  8. Need for immunization against influenza - Flu vaccine trivalent PF, 6mos and older(Flulaval,Afluria,Fluarix,Fluzone)  9. Hepatitis B vaccination declined   10. Blurred vision - Ambulatory referral to Ophthalmology   Patient was given the opportunity to ask questions.  Patient verbalized understanding of the plan and was able to repeat key elements of the plan.   This documentation was completed using Paediatric nurse.  Any transcriptional errors are unintentional.  Orders Placed This Encounter  Procedures   Flu vaccine trivalent PF, 6mos and older(Flulaval,Afluria,Fluarix,Fluzone)   VITAMIN D  25 Hydroxy (Vit-D Deficiency, Fractures)   Ambulatory referral to Ophthalmology     Requested Prescriptions   Signed Prescriptions Disp Refills   SUMAtriptan  (IMITREX ) 50 MG tablet 10 tablet 1    Sig: 1 tab PO daily 1-2 days prior to start of menses.  May repeat in 2 hrs.  Max2 tabs/24 hr   nicotine  polacrilex (NICORETTE ) 2 MG gum 100 tablet 4    Sig: Chew 1-2 pieces as needed. No more than 30 pieces/24 hr    Return if symptoms worsen or fail to improve, for Sign release to get PAP.  Barnie Louder, MD, FACP

## 2024-01-17 NOTE — Patient Instructions (Addendum)
 VISIT SUMMARY: You had your annual physical exam today. We discussed your current health concerns, including your cough, migraines, smoking habits, weight loss, vitamin D  levels, constipation, and vision issues. We also reviewed your vaccination history and general health maintenance.  YOUR PLAN: -ACUTE COUGH WITH SPUTUM PRODUCTION: You have a cough that produces clear mucus, likely due to a viral infection. Your symptoms are improving. You can use over-the-counter Robitussin if needed.  -MENSTRUAL MIGRAINE: You experience migraines around your menstrual cycle, which are effectively managed with Imitrex . Your prescription for Imitrex  has been refilled.  -NICOTINE  DEPENDENCE, CIGAR USE: You smoke two cigars daily and are trying to quit using nicotine  gum. Nicotine  dependence is an addiction to nicotine , commonly found in tobacco products. You have been prescribed nicotine  gum and instructed on its proper use. You should reduce your cigar use to one per day and then quit.  -OVERWEIGHT: Your BMI is 32, indicating you are overweight. You have lost 21 pounds through dietary changes and increased physical activity. Continue your weight loss efforts by reducing sugary snacks and substituting them with fruits, nuts, and yogurt. Consume lean meats and whole grains, and limit portion sizes of white carbohydrates.  -VITAMIN D  DEFICIENCY: Your vitamin D  levels were previously low at 24 ng/mL. Vitamin D  deficiency can lead to bone health issues. We will recheck your vitamin D  level and increase your intake to 800 IU daily if levels remain low.  -CONSTIPATION, INTERMITTENT: You experience intermittent constipation. Constipation is difficulty in passing stools. Increase your dietary fiber intake and use Benefiber or Miralax if constipation persists.  -UNSPECIFIED VISUAL DISTURBANCE: You report foggy vision and sensitivity to light at night. Visual disturbances can affect your ability to see clearly. You have been  referred for an eye exam.  -ADULT WELLNESS VISIT: This was a routine wellness visit. We discussed health maintenance, vaccinations, and screenings. You received a flu shot, and we signed a release to obtain your Pap smear results from Dr. Rutherford. You have been referred for an eye exam. Continue with dietary modifications to support weight management and regular exercise, including 30 minutes of walking 4-5 days a week.  INSTRUCTIONS: Please follow up for a recheck of your vitamin D  levels. Reduce your cigar use to one per day and then quit. Use nicotine  gum as prescribed. Continue with your dietary changes and exercise routine. Schedule an eye exam as referred. We will obtain your Pap smear results from Dr. Rutherford.   Healthy Eating, Adult Healthy eating may help you get and keep a healthy body weight, reduce the risk of chronic disease, and live a long and productive life. It is important to follow a healthy eating pattern. Your nutritional and calorie needs should be met mainly by different nutrient-rich foods. What are tips for following this plan? Reading food labels Read labels and choose the following: Reduced or low sodium products. Juices with 100% fruit juice. Foods with low saturated fats (<3 g per serving) and high polyunsaturated and monounsaturated fats. Foods with whole grains, such as whole wheat, cracked wheat, brown rice, and wild rice. Whole grains that are fortified with folic acid. This is recommended for females who are pregnant or who want to become pregnant. Read labels and do not eat or drink the following: Foods or drinks with added sugars. These include foods that contain brown sugar, corn sweetener, corn syrup, dextrose, fructose, glucose, high-fructose corn syrup, honey, invert sugar, lactose, malt syrup, maltose, molasses, raw sugar, sucrose, trehalose, or turbinado sugar. Limit your  intake of added sugars to less than 10% of your total daily calories. Do not eat  more than the following amounts of added sugar per day: 6 teaspoons (25 g) for females. 9 teaspoons (38 g) for males. Foods that contain processed or refined starches and grains. Refined grain products, such as white flour, degermed cornmeal, white bread, and white rice. Shopping Choose nutrient-rich snacks, such as vegetables, whole fruits, and nuts. Avoid high-calorie and high-sugar snacks, such as potato chips, fruit snacks, and candy. Use oil-based dressings and spreads on foods instead of solid fats such as butter, margarine, sour cream, or cream cheese. Limit pre-made sauces, mixes, and instant products such as flavored rice, instant noodles, and ready-made pasta. Try more plant-protein sources, such as tofu, tempeh, black beans, edamame, lentils, nuts, and seeds. Explore eating plans such as the Mediterranean diet or vegetarian diet. Try heart-healthy dips made with beans and healthy fats like hummus and guacamole. Vegetables go great with these. Cooking Use oil to saut or stir-fry foods instead of solid fats such as butter, margarine, or lard. Try baking, boiling, grilling, or broiling instead of frying. Remove the fatty part of meats before cooking. Steam vegetables in water or broth. Meal planning  At meals, imagine dividing your plate into fourths: One-half of your plate is fruits and vegetables. One-fourth of your plate is whole grains. One-fourth of your plate is protein, especially lean meats, poultry, eggs, tofu, beans, or nuts. Include low-fat dairy as part of your daily diet. Lifestyle Choose healthy options in all settings, including home, work, school, restaurants, or stores. Prepare your food safely: Wash your hands after handling raw meats. Where you prepare food, keep surfaces clean by regularly washing with hot, soapy water. Keep raw meats separate from ready-to-eat foods, such as fruits and vegetables. Cook seafood, meat, poultry, and eggs to the recommended  temperature. Get a food thermometer. Store foods at safe temperatures. In general: Keep cold foods at 40F (4.4C) or below. Keep hot foods at 140F (60C) or above. Keep your freezer at [facility] (-17.8C) or below. Foods are not safe to eat if they have been between the temperatures of 40-140F (4.4-60C) for more than 2 hours. What foods should I eat? Fruits Aim to eat 1-2 cups of fresh, canned (in natural juice), or frozen fruits each day. One cup of fruit equals 1 small apple, 1 large banana, 8 large strawberries, 1 cup (237 g) canned fruit,  cup (82 g) dried fruit, or 1 cup (240 mL) 100% juice. Vegetables Aim to eat 2-4 cups of fresh and frozen vegetables each day, including different varieties and colors. One cup of vegetables equals 1 cup (91 g) broccoli or cauliflower florets, 2 medium carrots, 2 cups (150 g) raw, leafy greens, 1 large tomato, 1 large bell pepper, 1 large sweet potato, or 1 medium white potato. Grains Aim to eat 5-10 ounce-equivalents of whole grains each day. Examples of 1 ounce-equivalent of grains include 1 slice of bread, 1 cup (40 g) ready-to-eat cereal, 3 cups (24 g) popcorn, or  cup (93 g) cooked rice. Meats and other proteins Try to eat 5-7 ounce-equivalents of protein each day. Examples of 1 ounce-equivalent of protein include 1 egg,  oz nuts (12 almonds, 24 pistachios, or 7 walnut halves), 1/4 cup (90 g) cooked beans, 6 tablespoons (90 g) hummus or 1 tablespoon (16 g) peanut butter. A cut of meat or fish that is the size of a deck of cards is about 3-4 ounce-equivalents (85 g). Of  the protein you eat each week, try to have at least 8 sounce (227 g) of seafood. This is about 2 servings per week. This includes salmon, trout, herring, sardines, and anchovies. Dairy Aim to eat 3 cup-equivalents of fat-free or low-fat dairy each day. Examples of 1 cup-equivalent of dairy include 1 cup (240 mL) milk, 8 ounces (250 g) yogurt, 1 ounces (44 g) natural cheese, or 1 cup (240  mL) fortified soy milk. Fats and oils Aim for about 5 teaspoons (21 g) of fats and oils per day. Choose monounsaturated fats, such as canola and olive oils, mayonnaise made with olive oil or avocado oil, avocados, peanut butter, and most nuts, or polyunsaturated fats, such as sunflower, corn, and soybean oils, walnuts, pine nuts, sesame seeds, sunflower seeds, and flaxseed. Beverages Aim for 6 eight-ounce glasses of water per day. Limit coffee to 3-5 eight-ounce cups per day. Limit caffeinated beverages that have added calories, such as soda and energy drinks. If you drink alcohol: Limit how much you have to: 0-1 drink a day if you are female. 0-2 drinks a day if you are female. Know how much alcohol is in your drink. In the [place] one drink is one 12 oz bottle of beer (355 mL), one 5 oz glass of wine (148 mL), or one 1 oz glass of hard liquor (44 mL). Seasoning and other foods Try not to add too much salt to your food. Try using herbs and spices instead of salt. Try not to add sugar to food. This information is based on [place] nutrition guidelines. To learn more, visit [employer]. Exact amounts may vary. You may need different amounts. This information is not intended to replace advice given to you by your health care provider. Make sure you discuss any questions you have with your health care provider. Document Revised: 01/09/2022 Document Reviewed: 01/09/2022 [employer] Patient Education  2024 [employer]                   Contains text generated by [employer].                                 Contains text generated by [employer].

## 2024-01-18 ENCOUNTER — Ambulatory Visit: Payer: Self-pay | Admitting: Internal Medicine

## 2024-01-18 LAB — VITAMIN D 25 HYDROXY (VIT D DEFICIENCY, FRACTURES): Vit D, 25-Hydroxy: 28.6 ng/mL — ABNORMAL LOW (ref 30.0–100.0)

## 2024-01-21 NOTE — Telephone Encounter (Signed)
 Pt given lab results per notes of Dr. Vicci on 01/18/24. Pt verbalized understanding.    Copied from CRM #[id number]. Topic: Clinical - Lab/Test Results >> Jan 21, 2024 11:40 AM Tonda B wrote: Reason for CRM: patient missed call from office about test  result results office ask for NT to give results

## 2024-01-21 NOTE — Progress Notes (Signed)
 Noted
